# Patient Record
Sex: Male | Born: 1940 | Race: White | Hispanic: No | Marital: Married | Smoking: Former smoker
Health system: Southern US, Community
[De-identification: ages and names within clinical notes are randomized; demographics above are authoritative.]

## PROBLEM LIST (undated history)

## (undated) DIAGNOSIS — I10 Essential (primary) hypertension: Secondary | ICD-10-CM

## (undated) DIAGNOSIS — N201 Calculus of ureter: Secondary | ICD-10-CM

## (undated) DIAGNOSIS — I2510 Atherosclerotic heart disease of native coronary artery without angina pectoris: Secondary | ICD-10-CM

## (undated) DIAGNOSIS — I252 Old myocardial infarction: Secondary | ICD-10-CM

## (undated) DIAGNOSIS — Z955 Presence of coronary angioplasty implant and graft: Secondary | ICD-10-CM

## (undated) DIAGNOSIS — E785 Hyperlipidemia, unspecified: Secondary | ICD-10-CM

## (undated) HISTORY — DX: Essential (primary) hypertension: I10

## (undated) HISTORY — DX: Atherosclerotic heart disease of native coronary artery without angina pectoris: I25.10

## (undated) HISTORY — PX: CARDIOVASCULAR STRESS TEST: SHX262

## (undated) HISTORY — DX: Hyperlipidemia, unspecified: E78.5

## (undated) HISTORY — PX: TRIGGER FINGER RELEASE: SHX641

---

## 1968-09-19 HISTORY — PX: ANTERIOR CRUCIATE LIGAMENT REPAIR: SHX115

## 1997-08-19 HISTORY — PX: CORONARY ANGIOPLASTY WITH STENT PLACEMENT: SHX49

## 1997-09-16 ENCOUNTER — Inpatient Hospital Stay (HOSPITAL_COMMUNITY): Admission: EM | Admit: 1997-09-16 | Discharge: 1997-09-19 | Payer: Self-pay | Admitting: Emergency Medicine

## 1997-11-02 ENCOUNTER — Observation Stay (HOSPITAL_COMMUNITY): Admission: AD | Admit: 1997-11-02 | Discharge: 1997-11-03 | Payer: Self-pay | Admitting: Cardiovascular Disease

## 1997-11-19 HISTORY — PX: CORONARY ANGIOPLASTY: SHX604

## 1997-11-22 ENCOUNTER — Encounter: Payer: Self-pay | Admitting: Cardiovascular Disease

## 1997-11-22 ENCOUNTER — Observation Stay (HOSPITAL_COMMUNITY): Admission: EM | Admit: 1997-11-22 | Discharge: 1997-11-23 | Payer: Self-pay | Admitting: Emergency Medicine

## 1998-02-15 ENCOUNTER — Encounter: Payer: Self-pay | Admitting: Cardiovascular Disease

## 1998-02-15 ENCOUNTER — Ambulatory Visit (HOSPITAL_COMMUNITY): Admission: RE | Admit: 1998-02-15 | Discharge: 1998-02-15 | Payer: Self-pay | Admitting: Cardiovascular Disease

## 1998-03-20 HISTORY — PX: CORONARY ANGIOPLASTY WITH STENT PLACEMENT: SHX49

## 1998-03-21 ENCOUNTER — Observation Stay (HOSPITAL_COMMUNITY): Admission: AD | Admit: 1998-03-21 | Discharge: 1998-03-22 | Payer: Self-pay | Admitting: Cardiovascular Disease

## 1998-09-25 ENCOUNTER — Emergency Department (HOSPITAL_COMMUNITY): Admission: EM | Admit: 1998-09-25 | Discharge: 1998-09-25 | Payer: Self-pay | Admitting: Emergency Medicine

## 1998-09-25 ENCOUNTER — Encounter: Payer: Self-pay | Admitting: Emergency Medicine

## 1998-12-08 ENCOUNTER — Inpatient Hospital Stay (HOSPITAL_COMMUNITY): Admission: EM | Admit: 1998-12-08 | Discharge: 1998-12-09 | Payer: Self-pay | Admitting: Emergency Medicine

## 2005-05-20 ENCOUNTER — Encounter: Payer: Self-pay | Admitting: Cardiovascular Disease

## 2009-09-06 ENCOUNTER — Ambulatory Visit: Payer: Self-pay | Admitting: Cardiovascular Disease

## 2009-12-26 ENCOUNTER — Ambulatory Visit
Admission: RE | Admit: 2009-12-26 | Discharge: 2009-12-26 | Payer: Self-pay | Source: Home / Self Care | Attending: Orthopedic Surgery | Admitting: Orthopedic Surgery

## 2009-12-26 HISTORY — PX: OTHER SURGICAL HISTORY: SHX169

## 2010-03-10 ENCOUNTER — Ambulatory Visit (INDEPENDENT_AMBULATORY_CARE_PROVIDER_SITE_OTHER): Payer: BC Managed Care – PPO | Admitting: Cardiovascular Disease

## 2010-03-10 DIAGNOSIS — I119 Hypertensive heart disease without heart failure: Secondary | ICD-10-CM

## 2010-03-10 DIAGNOSIS — Z9861 Coronary angioplasty status: Secondary | ICD-10-CM

## 2010-06-06 NOTE — H&P (Signed)
[place]. [facility]  Patient:    Rodney Hester                      MRN: [id number] Adm. Date:  [id number] Attending:  Koren Bound Dictator:   Darci Needle, M.D.                         History and Physical  REASON FOR ADMISSION:  Chest discomfort.  SUBJECTIVE:  The patient is 70 and has a history of coronary artery disease. He suffered an inferior myocardial infarction in 8/99.   He subsequently underwent  PTCA of the large first diagonal branch in 11/99.  He subsequently underwent the PTCA and stenting of the diagonal in 3/00 because of restenosis and also had angioplasty of the LAD at the same time.  Patient had done well since that time  until the past four days.  Over the past four days, he has experienced a flu-like complex of symptoms, including fatigue.  Today, he developed substernal tightness and shortness of breath that was mild to moderate, but did concern him enough that he went to the urgent care medical center for evaluation.  After the discomfort had been present for two to three hours, he was given a sublingual nitroglycerin and there was relatively prompt significant reduction in the discomfort.  Because of this, he was instructed to come to the emergency room and is now being admitted for observation and to rule out myocardial infarction.   Patient is currently pain free.  SIGNIFICANT PAST MEDICAL PROBLEMS: 1. Inferior myocardial infarction, treated with angioplasty and stenting of the  right coronary in 8/99. 2. History of PTCA, 11/99 and stent 3/00 to the first diagonal. 3. Hypercholesterolemia.  MEDICATIONS: 1. Lipitor 40 mg per day. 2. Coated aspirin 1 per day. 3. Metoprolol 25 mg b.i.d. 4. Claritin p.r.n.  HABITS:  Does not smoke or drink.  ALLERGIES:  He is allergic to either ZESTRIL OR ZOCOR.  FAMILY HISTORY:  Father died at age 81 of a myocardial infarction.   Mother is ge 68 and in good  health.  He has two sisters and one brother, all in good health.   REVIEW OF SYSTEMS:  Unremarkable.  OBJECTIVE:  On exam, patients blood pressure is 127/74, heart rate 74, he is afebrile, respiratory rate 16 and nonlabored.  Skin:  Warm and dry.  HEENT:  Exam reveals no exophthalmos; PERRL, extraocular movements are full.  Neck:  No JVD, carotid bruits or thyromegaly.  Lungs:  Clear to auscultation and percussion.  Cardiac:  Exam reveals no rubs, clicks, gallops, or murmurs.  Abdomen:  Soft, liver and spleen not palpable.  Bowel sounds are normal.  Extremities:  No edema.  Pulses are 2+ and symmetric in upper and lower extremities.  No peripheral edema is noted.  Neurologic:  Unremarkable.  No focal cranial nerve or motor deficits noted.  EKG reveals normal sinus rhythm, small inferior Q waves, no acute ST-T wave changes.   Chest x-ray from urgent care is normal.  LABORATORY DATA:  Unremarkable and includes a normal initial CPK that is 106 with an MB of 1.5 ng/ml.  Troponin I is less than 0.03 ng/ml.   The potassium is 4, UN 13, creatinine 0.8, sodium 140.  WBC 9,400, hemoglobin 13.8, platelet count 176,000.  ASSESSMENT:  Patients presenting symptoms are somewhat atypical.  Given the patients history of previous infarction and angioplasty  x 3, he is admitted to he hospital to rule out myocardial infarction and for further evaluation.  PLAN: 1. Serial enzymes. 2. EKG in the a.m. 3. Stress Cardiolite in the a.m. 4. Cardiac catheterization per Dr. Kristeen Miss. 5. Subcutaneous Lovenox. 6. IV nitroglycerin for chest pain. DD:  12/08/98 TD:  12/08/98 Job: [id number]

## 2010-09-19 ENCOUNTER — Encounter: Payer: Self-pay | Admitting: *Deleted

## 2010-09-19 DIAGNOSIS — I2510 Atherosclerotic heart disease of native coronary artery without angina pectoris: Secondary | ICD-10-CM | POA: Insufficient documentation

## 2010-09-30 ENCOUNTER — Other Ambulatory Visit: Payer: Self-pay | Admitting: Cardiovascular Disease

## 2010-09-30 DIAGNOSIS — E785 Hyperlipidemia, unspecified: Secondary | ICD-10-CM

## 2010-09-30 DIAGNOSIS — I2510 Atherosclerotic heart disease of native coronary artery without angina pectoris: Secondary | ICD-10-CM

## 2010-10-03 ENCOUNTER — Encounter: Payer: Self-pay | Admitting: Cardiovascular Disease

## 2010-10-03 ENCOUNTER — Ambulatory Visit (INDEPENDENT_AMBULATORY_CARE_PROVIDER_SITE_OTHER): Payer: BC Managed Care – PPO | Admitting: Cardiovascular Disease

## 2010-10-03 ENCOUNTER — Other Ambulatory Visit (INDEPENDENT_AMBULATORY_CARE_PROVIDER_SITE_OTHER): Payer: BC Managed Care – PPO | Admitting: *Deleted

## 2010-10-03 VITALS — BP 128/80 | HR 60 | Wt 154.2 lb

## 2010-10-03 DIAGNOSIS — I2510 Atherosclerotic heart disease of native coronary artery without angina pectoris: Secondary | ICD-10-CM

## 2010-10-03 DIAGNOSIS — E785 Hyperlipidemia, unspecified: Secondary | ICD-10-CM

## 2010-10-03 LAB — HEPATIC FUNCTION PANEL
ALT: 32 U/L (ref 0–53)
AST: 35 U/L (ref 0–37)
Alkaline Phosphatase: 84 U/L (ref 39–117)
Bilirubin, Direct: 0.1 mg/dL (ref 0.0–0.3)
Total Bilirubin: 0.7 mg/dL (ref 0.3–1.2)

## 2010-10-03 LAB — BASIC METABOLIC PANEL
Calcium: 9.4 mg/dL (ref 8.4–10.5)
GFR: 92.76 mL/min (ref >60.00)
Glucose, Bld: 140 mg/dL — ABNORMAL HIGH (ref 70–99)
Sodium: 145 mEq/L (ref 135–145)

## 2010-10-03 LAB — LIPID PANEL: HDL: 55.1 mg/dL (ref >39.00)

## 2010-10-03 NOTE — Assessment & Plan Note (Addendum)
[name] doing very well. We'll continue the same medications.  We have gone lipid profile and basic metabolic profile today. The results are pending. I'll see him again in one year.

## 2010-10-03 NOTE — Progress Notes (Signed)
Raj Janus Date of Birth  1940-09-09 [facility] / [facility] [address]     [address] [place], [place]  [address] [phone]  Fax  [phone]  History of Present Illness:  70 year old gentleman with a history of coronary artery disease. Status post myocardial infarction in 1998.  He has done very well. He has not had any episodes of angina. He works out on a daily basis.  Current Outpatient Prescriptions on File Prior to Visit  Medication Sig Dispense Refill  . aspirin 81 MG tablet Take 81 mg by mouth daily.        Marland Kitchen atorvastatin (LIPITOR) 40 MG tablet Take 40 mg by mouth daily.        . metoprolol (LOPRESSOR) 50 MG tablet Take 50 mg by mouth 2 (two) times daily. Taking 1/2 bid       . nitroGLYCERIN (NITROSTAT) 0.4 MG SL tablet Place 0.4 mg under the tongue every 5 (five) minutes as needed.        Marland Kitchen telmisartan (MICARDIS) 80 MG tablet Take 80 mg by mouth daily.        . ALBUTEROL IN Inhale into the lungs. As directed         Allergies  Allergen Reactions  . Lisinopril   . Zocor (Simvastatin)     Past Medical History  Diagnosis Date  . Hyperlipidemia   . Hypertension   . Coronary artery disease     MI 8/99    Past Surgical History  Procedure Date  . Cardiac catheterization 8/99    angioplasty and stenting of right coronary  . Cardiac catheterization 11/99    PTCA   . Cardiac catheterization 3/00    stent , first diagonal    History  Smoking status  . Former Smoker  Smokeless tobacco  . Not on file    History  Alcohol Use     Family History  Problem Relation Age of Onset  . Heart attack Father     Reviw of Systems:  Reviewed in the HPI.  All other systems are negative.  Physical Exam: BP 128/80  Pulse 60  Wt 154 lb 3.2 oz (69.945 kg) The patient is alert and oriented x 3.  The mood and affect are normal.   Skin: warm and dry.  Color is normal.    HEENT:   the sclera are nonicteric.  The mucous membranes are  moist.  The carotids are 2+ without bruits.  There is no thyromegaly.  There is no JVD.    Lungs: clear.  The chest wall is non tender.    Heart: regular rate with a normal S1 and S2.  There are no murmurs, gallops, or rubs. The PMI is not displaced.     Abdomen: good bowel sounds.  There is no guarding or rebound.  There is no hepatosplenomegaly or tenderness.  There are no masses.   Extremities:  no clubbing, cyanosis, or edema.  The legs are without rashes.  The distal pulses are intact.   Neuro:  Cranial nerves II - XII are intact.  Motor and sensory functions are intact.    The gait is normal.  ECG:  Assessment / Plan:

## 2010-10-20 ENCOUNTER — Other Ambulatory Visit: Payer: Self-pay | Admitting: Cardiovascular Disease

## 2010-10-21 ENCOUNTER — Other Ambulatory Visit: Payer: Self-pay | Admitting: *Deleted

## 2010-11-24 ENCOUNTER — Other Ambulatory Visit: Payer: Self-pay | Admitting: Cardiovascular Disease

## 2010-12-29 ENCOUNTER — Other Ambulatory Visit: Payer: Self-pay | Admitting: Cardiovascular Disease

## 2010-12-29 NOTE — Telephone Encounter (Signed)
Fax Received. Refill Completed. Rodney Hester (R.M.A)   

## 2011-05-06 ENCOUNTER — Other Ambulatory Visit: Payer: Self-pay | Admitting: Cardiovascular Disease

## 2011-05-06 MED ORDER — ATORVASTATIN CALCIUM 40 MG PO TABS: 40.0000 mg | ORAL_TABLET | Freq: Every day | ORAL | Status: DC

## 2011-05-29 ENCOUNTER — Other Ambulatory Visit: Payer: Self-pay | Admitting: Orthopedic Surgery

## 2011-06-01 NOTE — H&P (Signed)
  Mr. Waymire is a well known patient of ours who returns with a new predicament involving his left ring finger. He awoke approximately 5-6 weeks ago with symptoms of triggering of the left ring finger. He has no known injury. He tried anti-inflammatories and ice without relief. He states that his symptoms are much worse in the morning though he does have triggering throughout the day which is quite bothersome and painful for him. He presents today for orthopaedic hand surgery consult.   His past medical history is reviewed. He has no history of diabetes. Review of systems reveals previous heart attack, asthma, easy bleeding and bruising. Current medications are Micardis 80 mg 1 PO qd, Metoprolol 50 mg 1 PO qd, Atorvastatin 40 mg 1 PO qd and aspirin 325 mg PO qd. He has had several hand surgeries in the past all without anesthetic complications. Family history reveals no history of diabetes, heart disease or hypertension. There is a family history of arthritis. His social history reveals he does not smoke or consume alcohol based beverages. He is married and resides with his wife in [place], [place]. Dr. Kirby Funk is his primary medical doctor.  On exam he is a well appearing 71 year old right hand dominant male. Examination of his hands reveals active triggering of the left ring finger with a swollen tendon nodule at the area of the A-1 pulley. He does have intact neurovascular status. There is no triggering of any of the other digits. He has normal sweat patterns and dermatoglyphics.   Impression: STS left ring finger.   Plan: Patient underwent surgical treatment for the right ring finger in December of 2011 and wishes to proceed with surgical intervention for this problem. We tried injections to the right ring finger without relief. The surgery, after care, risks and benefits were described in detail. He is in agreement with this plan. He will therefore be scheduled as an outpatient to have release of  the A-1 pulley of the left ring finger under local anesthesia.   H&P documentation: 06/02/2011  -History and Physical Reviewed  -Patient has been re-examined  -No change in the plan of care  Wyn Forster, MD

## 2011-06-02 ENCOUNTER — Ambulatory Visit (HOSPITAL_BASED_OUTPATIENT_CLINIC_OR_DEPARTMENT_OTHER)
Admission: RE | Admit: 2011-06-02 | Discharge: 2011-06-02 | Disposition: A | Discharge status: Home / Self Care | Payer: BC Managed Care – PPO | Source: Ambulatory Visit | Attending: Orthopedic Surgery | Admitting: Orthopedic Surgery

## 2011-06-02 ENCOUNTER — Encounter (HOSPITAL_BASED_OUTPATIENT_CLINIC_OR_DEPARTMENT_OTHER)
Admission: RE | Disposition: A | Discharge status: Home / Self Care | Payer: Self-pay | Source: Ambulatory Visit | Attending: Orthopedic Surgery

## 2011-06-02 DIAGNOSIS — M65839 Other synovitis and tenosynovitis, unspecified forearm: Secondary | ICD-10-CM | POA: Insufficient documentation

## 2011-06-02 DIAGNOSIS — M6530 Trigger finger, unspecified finger: Secondary | ICD-10-CM | POA: Insufficient documentation

## 2011-06-02 SURGERY — MINOR RELEASE TRIGGER FINGER/A-1 PULLEY
Anesthesia: LOCAL | Site: Finger | Laterality: Left

## 2011-06-02 MED ORDER — CHLORHEXIDINE GLUCONATE 4 % EX LIQD: 60.0000 mL | Freq: Once | CUTANEOUS | Status: DC

## 2011-06-02 MED ORDER — LIDOCAINE HCL 2 % IJ SOLN
INTRAMUSCULAR | Status: DC | PRN
  Administered 2011-06-02: 2.5 mL

## 2011-06-02 MED ORDER — DOXYCYCLINE HYCLATE 100 MG PO TABS: 100.0000 mg | ORAL_TABLET | Freq: Two times a day (BID) | ORAL | Status: AC

## 2011-06-02 MED ORDER — TRAMADOL HCL 50 MG PO TABS: ORAL_TABLET | ORAL | Status: AC

## 2011-06-02 SURGICAL SUPPLY — 37 items
BANDAGE ADHESIVE 1X3 (GAUZE/BANDAGES/DRESSINGS) IMPLANT
BLADE SURG 15 STRL LF DISP TIS (BLADE) ×1 IMPLANT
BLADE SURG 15 STRL SS (BLADE) ×2
BNDG CMPR 9X4 STRL LF SNTH (GAUZE/BANDAGES/DRESSINGS) ×1
BNDG CMPR MD 5X2 ELC HKLP STRL (GAUZE/BANDAGES/DRESSINGS) ×1
BNDG ELASTIC 2 VLCR STRL LF (GAUZE/BANDAGES/DRESSINGS) ×2 IMPLANT
BNDG ESMARK 4X9 LF (GAUZE/BANDAGES/DRESSINGS) ×1 IMPLANT
BRUSH SCRUB EZ PLAIN DRY (MISCELLANEOUS) ×2 IMPLANT
CLOTH BEACON ORANGE TIMEOUT ST (SAFETY) ×2 IMPLANT
CORDS BIPOLAR (ELECTRODE) IMPLANT
COVER MAYO STAND STRL (DRAPES) ×2 IMPLANT
COVER TABLE BACK 60X90 (DRAPES) ×2 IMPLANT
CUFF TOURNIQUET SINGLE 18IN (TOURNIQUET CUFF) ×1 IMPLANT
DECANTER SPIKE VIAL GLASS SM (MISCELLANEOUS) IMPLANT
DRAPE EXTREMITY T 121X128X90 (DRAPE) ×2 IMPLANT
DRAPE SURG 17X23 STRL (DRAPES) ×2 IMPLANT
GAUZE SPONGE 4X4 12PLY STRL LF (GAUZE/BANDAGES/DRESSINGS) ×4 IMPLANT
GAUZE XEROFORM 1X8 LF (GAUZE/BANDAGES/DRESSINGS) IMPLANT
GLOVE BIOGEL M 7.0 STRL (GLOVE) ×3 IMPLANT
GLOVE BIOGEL M STRL SZ7.5 (GLOVE) ×2 IMPLANT
GLOVE ORTHO TXT STRL SZ7.5 (GLOVE) ×2 IMPLANT
GOWN PREVENTION PLUS XLARGE (GOWN DISPOSABLE) ×4 IMPLANT
NEEDLE 27GAX1X1/2 (NEEDLE) ×1 IMPLANT
PACK BASIN DAY SURGERY FS (CUSTOM PROCEDURE TRAY) ×2 IMPLANT
PADDING CAST ABS 4INX4YD NS (CAST SUPPLIES) ×1
PADDING CAST ABS COTTON 4X4 ST (CAST SUPPLIES) ×1 IMPLANT
SPONGE GAUZE 4X4 12PLY (GAUZE/BANDAGES/DRESSINGS) ×2 IMPLANT
STOCKINETTE 4X48 STRL (DRAPES) ×2 IMPLANT
SUT ETHILON 5 0 P 3 18 (SUTURE) ×1
SUT NYLON ETHILON 5-0 P-3 1X18 (SUTURE) ×1 IMPLANT
SUT PROLENE 4 0 P 3 18 (SUTURE) ×1 IMPLANT
SYR 3ML 23GX1 SAFETY (SYRINGE) IMPLANT
SYR CONTROL 10ML LL (SYRINGE) ×1 IMPLANT
TOWEL OR 17X24 6PK STRL BLUE (TOWEL DISPOSABLE) ×4 IMPLANT
TRAY DSU PREP LF (CUSTOM PROCEDURE TRAY) ×2 IMPLANT
UNDERPAD 30X30 INCONTINENT (UNDERPADS AND DIAPERS) ×2 IMPLANT
WATER STERILE IRR 1000ML POUR (IV SOLUTION) ×1 IMPLANT

## 2011-06-02 NOTE — Brief Op Note (Signed)
06/02/2011  11:23 AM  PATIENT:  Rodney Hester  71 y.o. male  PRE-OPERATIVE DIAGNOSIS:  left ring trigger finger  POST-OPERATIVE DIAGNOSIS:  left ring trigger finger    PROCEDURE: RELEASE TRIGGER FINGER LEFT RING FINGER  SURGEON:  Wyn Forster., MD   PHYSICIAN ASSISTANT:   ASSISTANTS: Mallory Shirk.A-C   ANESTHESIA:   local  EBL:     BLOOD ADMINISTERED:none  DRAINS: none   LOCAL MEDICATIONS USED:  XYLOCAINE   SPECIMEN:  No Specimen  DISPOSITION OF SPECIMEN:  N/A  COUNTS:  YES  TOURNIQUET:   Total Tourniquet Time Documented: Upper Arm (Left) - 12 minutes  DICTATION: .Other Dictation: Dictation Number [id number]  PLAN OF CARE: Discharge to home after PACU  PATIENT DISPOSITION:  PACU - hemodynamically stable.

## 2011-06-02 NOTE — Op Note (Signed)
[id number] 

## 2011-06-02 NOTE — Discharge Instructions (Signed)

## 2011-06-03 ENCOUNTER — Encounter (HOSPITAL_BASED_OUTPATIENT_CLINIC_OR_DEPARTMENT_OTHER): Payer: Self-pay

## 2011-06-03 NOTE — Op Note (Signed)
NAMEMUAZ, SHOREY NO.:  [id number]  MEDICAL RECORD NO.:  [id number]  LOCATION:                                 FACILITY:  PHYSICIAN:  Katy Fitch. Ngai Parcell, M.D.      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  Identification of substantial A0 pulley leading to persistent triggering after release of the A1 pulley.  OPERATION:  Release of A0 and A1 pulleys, left ring finger.  OPERATING SURGEON:  Katy Fitch. Irine Heminger, M.D.  ASSISTANT:  Marveen Reeks. Dasnoit, PA-C.  ANESTHESIA:  Lidocaine 2% field block and flexor sheath block.  This was performed as a minor operating room procedure.  INDICATIONS:  Savas Elvin is a 71 year old gentleman, who enjoys playing golf.  We have followed him for stenosing tenosynovitis of his fingers.  He currently has a painful triggering of his left ring finger, which he states it has been the most painful trigger he has had to date. He has elected not to try steroid injection after failure of previous treatment of his right ring finger trigger finger with surgery after failed injection.  After informed consent, he presents at this time anticipating the release of his left ring finger A1 pulley.  PROCEDURE:  Hawthorne Day was brought to room 5 of the [facility] and placed supine position on the operating table.  Following informed consent, alcohol and Betadine prep, 2% lidocaine was infiltrated in the path of intended incision in the palm overlying the ring finger flexor sheath.  The left arm was then prepped with Betadine soap and solution, sterilely draped.  We initially elected to try an Esmarch bandage on the forearm as an arterial tourniquet.  After informed  consent and a routine surgical time-out, we exsanguinated the left hand and arm with an Esmarch and left the Esmarch on the proximal forearm as a tourniquet.  This did not control Mr. Pitta's blood pressure.  At age 71, he likely has a degree of systolic hypertension exacerbated by being wide awake in the OR.  We subsequently placed a pneumatic tourniquet on the proximal left brachium, followed by exsanguination of the left arm with Esmarch bandage and inflation of the arterial tourniquet to 250 mmHg.  After confirming adequate anesthesia, we proceeded with a short oblique incision directly over the A1 pulley.  Subcutaneous tissues were carefully divided revealing abundant fat pad.  Soft tissues were carefully retracted followed by placement of 4 blunt Ragnell retractors. The A1 pulley was isolated, split with scalpel and scissors.  The tendons were inspected and found to be invested with a minor inflammatory synovium.  Thereafter Mr. Retana demonstrated full active range of motion, but had residual proximal triggering, therefore we performed a proximal dissection identifying a discrete 4 mm wide A0 pulley.  This was likewise released.  Thereafter full range of motion of the fingers recovered.  The wound was inspected for bleeding points, followed by release of the tourniquet.  The  wound was repaired with intradermal 4-0 Prolene suture.  A compressive dressing was applied with Xeroflo sterile gauze and Ace wrap.  For aftercare, Mr. Dimaio work on range of motion exercises immediately.  We anticipate that he will remove his dressing in 3 days and begin using Band-Aids.  We will see him back for followup in our office in 1 week for suture removal.  For aftercare, he is provided Ultram 50 mg, 1 p.o. q.4 hours p.r.n. pain, 20 tablets, without refill; also doxycycline 100 mg p.o. b.i.d. x4 days as prophylactic antibiotic.     Katy Fitch Charlane Westry, M.D.     RVS/MEDQ  D:   06/02/2011  T:  06/02/2011  Job:  [id number]

## 2011-06-05 ENCOUNTER — Encounter (HOSPITAL_BASED_OUTPATIENT_CLINIC_OR_DEPARTMENT_OTHER): Payer: Self-pay | Admitting: Orthopedic Surgery

## 2011-06-20 ENCOUNTER — Other Ambulatory Visit: Payer: Self-pay | Admitting: *Deleted

## 2011-06-20 MED ORDER — TELMISARTAN 80 MG PO TABS: 80.0000 mg | ORAL_TABLET | Freq: Every day | ORAL | Status: DC

## 2011-06-30 ENCOUNTER — Other Ambulatory Visit: Payer: Self-pay | Admitting: *Deleted

## 2011-06-30 MED ORDER — METOPROLOL TARTRATE 50 MG PO TABS: 25.0000 mg | ORAL_TABLET | Freq: Two times a day (BID) | ORAL | Status: DC

## 2011-06-30 NOTE — Telephone Encounter (Signed)
Fax Received. Refill Completed. Rodney Hester (R.M.A)   

## 2011-07-31 ENCOUNTER — Encounter (HOSPITAL_COMMUNITY): Payer: Self-pay

## 2011-07-31 NOTE — Pre-Procedure Instructions (Signed)
No aspirin, advil , or toradol until after litho  Take laxative as instructed in blue folder   

## 2011-08-02 NOTE — H&P (Signed)
History of Present Illness   [name] is still asymptomatic with his left ureteral stone. He says when he golf's for 30 minutes or longer he can have some soreness the next morning which is probably not related. He has not seen anymore blood in his urine. He has mild stable frequency.   I believe I can see the stone nicely at L5 spinous process on the left side on KUB.  I drew him a picture. I went over watchful waiting recognizing the size and the shape of the stone. I went over lithotripsy.   We talked about ESWL in detail. Pros, cons, general surgical and anesthetic risks, and other options including watchful waiting and ureteroscopy were discussed. Success and failure rates and need for further/repeat therapy were discussed. Risks were described but not limited to pain, infection, sepsis, and bleeding. The risk of renal and ureteral trauma with short and long term sequelae was discussed. The risk of injury to adjacent structures was discussed. The risk of needing a stent post-ESWL was discussed.  I went over ureteroscopy. I went over pros and cons and risks including likely requiring a stent and injury requiring further surgery and even open surgery. He understood that ureteroscopy may be challenging with the edema issue that appears to be associated with a stone. He understands that lithotripsy may be less successful, no only because of stone hardness but also because of the associated swelling or edema.   Review of systems: No change in bowel or neurologic status.   He understood that ureteroscopy would also prove to be likely diagnostic for the edema and/or lesion.     [name] smoked 40 years ago.      Past Medical History Problems  1. History of  Coronary Artery Disease V12.59 2. 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 Former Smoker V15.82 4. History of  Fracture 829.0 5. History of  Fracture Of The Clavicle 810.00 6. History of   Hyperlipidemia 272.4 7. History of  Insomnia 780.52 8. History of  Lower Back Pain Chronic 9. 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 History of  Knee Surgery 2. History of  Rhinologic Surgery  Current Meds 1. Atorvastatin Calcium 40 MG Oral Tablet; Therapy: 17Apr2013 to 2. Coenzyme Q-10 100 MG Oral Capsule; Therapy: (Recorded:02Jul2013) to 3. CVS Aspirin 325 MG Oral Tablet; Therapy: (Recorded:02Jul2013) to 4. Micardis 80 MG Oral Tablet; Therapy: 01Jun2013 to 5. Multi Vitamin/Minerals TABS; Therapy: (Recorded:02Jul2013) to  Allergies Medication  1. Zestril TABS 2. Zocor TABS Non-Medication  3. Latex  Social History Problems  1. Alcohol Use wine 2-3 a week 2. Marital History - Currently Married 3. 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 Gross Hematuria 599.71 2. Urinary Calculus On The Left 592.9  Plan  UA With REFLEX  Status: Resulted - Requires Verification  Done: 01Jan0001 12:00AM Ordered Today; For: Health Maintenance (V70.0); Ordered By: Alfredo Martinez  Due: 14Jul2013 Marked Important; Last Updated By: Blinda Leatherwood   Discussion/Summary   Both [name] and I came up with the same conclusion. He elected to have lithotripsy. He would like to move on with things. Issues to go to the [facility] ER or call the office were discussed. He will have a follow-up CT 2-3 months following to make sure the ureter looks normal. I did not send urine for cytology today.  After a  thorough review of the management options for the patient's condition the patient  elected to proceed with surgical therapy as noted above. We have discussed the potential benefits and risks of the procedure, side effects of the proposed treatment, the likelihood of the patient achieving the goals of the procedure, and any potential problems that might occur during the procedure or recuperation. Informed consent has been obtained.

## 2011-08-03 ENCOUNTER — Ambulatory Visit (HOSPITAL_COMMUNITY)
Admission: RE | Admit: 2011-08-03 | Discharge: 2011-08-03 | Disposition: A | Discharge status: Home / Self Care | Payer: BC Managed Care – PPO | Source: Ambulatory Visit | Attending: Urology | Admitting: Urology

## 2011-08-03 ENCOUNTER — Other Ambulatory Visit: Payer: Self-pay | Admitting: Urology

## 2011-08-03 ENCOUNTER — Ambulatory Visit (HOSPITAL_COMMUNITY): Payer: BC Managed Care – PPO

## 2011-08-03 ENCOUNTER — Encounter (HOSPITAL_COMMUNITY)
Admission: RE | Disposition: A | Discharge status: Home / Self Care | Payer: Self-pay | Source: Ambulatory Visit | Attending: Urology

## 2011-08-03 ENCOUNTER — Encounter (HOSPITAL_COMMUNITY): Payer: Self-pay | Admitting: *Deleted

## 2011-08-03 DIAGNOSIS — I252 Old myocardial infarction: Secondary | ICD-10-CM | POA: Insufficient documentation

## 2011-08-03 DIAGNOSIS — Z01810 Encounter for preprocedural cardiovascular examination: Secondary | ICD-10-CM | POA: Insufficient documentation

## 2011-08-03 DIAGNOSIS — I10 Essential (primary) hypertension: Secondary | ICD-10-CM | POA: Insufficient documentation

## 2011-08-03 DIAGNOSIS — N201 Calculus of ureter: Secondary | ICD-10-CM | POA: Insufficient documentation

## 2011-08-03 DIAGNOSIS — E785 Hyperlipidemia, unspecified: Secondary | ICD-10-CM | POA: Insufficient documentation

## 2011-08-03 DIAGNOSIS — Z79899 Other long term (current) drug therapy: Secondary | ICD-10-CM | POA: Insufficient documentation

## 2011-08-03 HISTORY — PX: EXTRACORPOREAL SHOCK WAVE LITHOTRIPSY: SHX1557

## 2011-08-03 SURGERY — LITHOTRIPSY, ESWL
Anesthesia: LOCAL | Laterality: Left

## 2011-08-03 MED ORDER — DIPHENHYDRAMINE HCL 25 MG PO CAPS
ORAL_CAPSULE | ORAL | Status: AC
  Filled 2011-08-03: qty 1

## 2011-08-03 MED ORDER — DIAZEPAM 5 MG PO TABS
ORAL_TABLET | ORAL | Status: AC
  Filled 2011-08-03: qty 2

## 2011-08-03 MED ORDER — DIPHENHYDRAMINE HCL 25 MG PO CAPS
25.0000 mg | ORAL_CAPSULE | ORAL | Status: AC
  Administered 2011-08-03: 25 mg via ORAL

## 2011-08-03 MED ORDER — CIPROFLOXACIN HCL 500 MG PO TABS
ORAL_TABLET | ORAL | Status: AC
  Filled 2011-08-03: qty 1

## 2011-08-03 MED ORDER — DIAZEPAM 5 MG PO TABS
10.0000 mg | ORAL_TABLET | ORAL | Status: AC
  Administered 2011-08-03: 10 mg via ORAL

## 2011-08-03 MED ORDER — CIPROFLOXACIN HCL 500 MG PO TABS
500.0000 mg | ORAL_TABLET | ORAL | Status: AC
  Administered 2011-08-03: 500 mg via ORAL

## 2011-08-03 MED ORDER — DEXTROSE-NACL 5-0.45 % IV SOLN
INTRAVENOUS | Status: DC
  Administered 2011-08-03: 18:00:00 via INTRAVENOUS

## 2011-08-03 NOTE — Interval H&P Note (Signed)
History and Physical Interval Note:  08/03/2011 4:30 PM  Rodney Hester  has presented today for surgery, with the diagnosis of left ureteral stone  The various methods of treatment have been discussed with the patient and family. After consideration of risks, benefits and other options for treatment, the patient has consented to  Procedure(s) (LRB): EXTRACORPOREAL SHOCK WAVE LITHOTRIPSY (ESWL) (Left) as a surgical intervention .  The patient's history has been reviewed, patient examined, no change in status, stable for surgery.  I have reviewed the patients' chart and labs.  Questions were answered to the patient's satisfaction.     Aliana Kreischer A

## 2011-08-04 ENCOUNTER — Encounter (HOSPITAL_COMMUNITY): Payer: Self-pay

## 2011-09-03 ENCOUNTER — Other Ambulatory Visit: Payer: Self-pay | Admitting: Urology

## 2011-09-08 ENCOUNTER — Encounter (HOSPITAL_BASED_OUTPATIENT_CLINIC_OR_DEPARTMENT_OTHER): Payer: Self-pay | Admitting: *Deleted

## 2011-09-08 NOTE — Progress Notes (Signed)
NPO AFTER MN. ARRIVES AT 0815. NEEDS ISTAT AND KUB. CURRENT EKG IN EPIC AND CHART. WILL TAKE MICARDIS AND LOPRESSOR AM OF SURG W/ SIP OF WATER.

## 2011-09-10 NOTE — H&P (Signed)
History of Present Illness     Ureterolithiasis: He was evaluated for hematuria and found to have a 5 x 9 mm stone in the distal left ureter. He this was treated with lithotripsy on 08/03/11 however the stone appeared to show no fragmentation. A previous CT scan revealed no renal calculi present.  Interval history: He said he's been dealing with this stone for about 2 months. He would like to get it behind him. He still not having a lot of pain but he also has not passed his stone.   Past Medical History Problems  1. History of  Coronary Artery Disease V12.59 2. 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 Former Smoker V15.82 4. History of  Fracture 829.0 5. History of  Fracture Of The Clavicle 810.00 6. History of  Hyperlipidemia 272.4 7. History of  Insomnia 780.52 8. History of  Lower Back Pain Chronic 9. 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 History of  Knee Surgery 2. History of  Lithotripsy 3. History of  Rhinologic Surgery  Current Meds 1. Atorvastatin Calcium 40 MG Oral Tablet; Therapy: 17Apr2013 to 2. Coenzyme Q-10 100 MG Oral Capsule; Therapy: (Recorded:02Jul2013) to 3. CVS Aspirin 325 MG Oral Tablet; Therapy: (Recorded:02Jul2013) to 4. Hydrocodone-Acetaminophen 5-500 MG Oral Tablet; Therapy: 15Jul2013 to 5. Micardis 80 MG Oral Tablet; Therapy: 01Jun2013 to 6. Multi Vitamin/Minerals TABS; Therapy: (Recorded:02Jul2013) to  Allergies Medication  1. Zestril TABS 2. Zocor TABS Non-Medication  3. Latex  Social History Problems  1. Alcohol Use wine 2-3 a week 2. Marital History - Currently Married 3. Occupation: works in Airline pilot 2 days a week   Review of Systems Constitutional, skin, eye, otolaryngeal, hematologic/lymphatic, cardiovascular, pulmonary, endocrine, musculoskeletal, gastrointestinal, neurological and psychiatric system(s) were reviewed and pertinent findings if present are noted.  Genitourinary:  hematuria.    Vitals Vital Signs  BMI Calculated: 27.11 BSA Calculated: 1.73 Height: 5 ft 3 in Weight: 153 lb  Blood Pressure: 157 / 74 Temperature: 98.3 F Heart Rate: 57  Physical Exam Constitutional: Well nourished and well developed . No acute distress.  ENT:. The ears and nose are normal in appearance.  Neck: The appearance of the neck is normal and no neck mass is present.  Pulmonary: No respiratory distress and normal respiratory rhythm and effort.  Cardiovascular: Heart rate and rhythm are normal . No peripheral edema.  Abdomen: The abdomen is soft and nontender. No masses are palpated. No CVA tenderness. No hernias are palpable. No hepatosplenomegaly noted.  Lymphatics: The femoral and inguinal nodes are not enlarged or tender.  Skin: Normal skin turgor, no visible rash and no visible skin lesions.  Neuro/Psych:. Mood and affect are appropriate.   . Genitourinary: On physical examination Mr. Speir had a 40 gram benign feeling prostate.  He had normal male genitalia.   Assessment Assessed  1. Distal Ureteral Stone On The Left 592.1   I went over with him the treatment options which would be continued medical expulsive therapy which I don't think is probably a good idea since his stone has not shown further progression. The next option would be lithotripsy and we did discuss the fact that this may fragment the stone although it didn't fragment initially and since there was no appreciable change in the stone I have not recommended repeating that but rather treating the stone with ureteroscopy. I went over ureteroscopy with him in detail including its potential risks and complications we also discussed the probability  of success and the likely need for a stent after the procedure. He understands, has had his questions answered and has elected to proceed.   Plan    We will proceed with left ureteroscopy and laser lithotripsy of his left distal ureteral stone. We discussed the  fact that latex does not cause an allergy.

## 2011-09-11 ENCOUNTER — Encounter (HOSPITAL_BASED_OUTPATIENT_CLINIC_OR_DEPARTMENT_OTHER): Payer: Self-pay | Admitting: *Deleted

## 2011-09-11 ENCOUNTER — Ambulatory Visit (HOSPITAL_BASED_OUTPATIENT_CLINIC_OR_DEPARTMENT_OTHER)
Admission: RE | Admit: 2011-09-11 | Discharge: 2011-09-11 | Disposition: A | Discharge status: Home / Self Care | Payer: BC Managed Care – PPO | Source: Ambulatory Visit | Attending: Urology | Admitting: Urology

## 2011-09-11 ENCOUNTER — Ambulatory Visit (HOSPITAL_BASED_OUTPATIENT_CLINIC_OR_DEPARTMENT_OTHER): Payer: BC Managed Care – PPO | Admitting: Anesthesiology

## 2011-09-11 ENCOUNTER — Encounter (HOSPITAL_BASED_OUTPATIENT_CLINIC_OR_DEPARTMENT_OTHER): Payer: Self-pay | Admitting: Anesthesiology

## 2011-09-11 ENCOUNTER — Ambulatory Visit (HOSPITAL_COMMUNITY): Payer: BC Managed Care – PPO

## 2011-09-11 ENCOUNTER — Encounter (HOSPITAL_BASED_OUTPATIENT_CLINIC_OR_DEPARTMENT_OTHER)
Admission: RE | Disposition: A | Discharge status: Home / Self Care | Payer: Self-pay | Source: Ambulatory Visit | Attending: Urology

## 2011-09-11 DIAGNOSIS — N201 Calculus of ureter: Secondary | ICD-10-CM

## 2011-09-11 DIAGNOSIS — I10 Essential (primary) hypertension: Secondary | ICD-10-CM | POA: Insufficient documentation

## 2011-09-11 DIAGNOSIS — E785 Hyperlipidemia, unspecified: Secondary | ICD-10-CM | POA: Insufficient documentation

## 2011-09-11 DIAGNOSIS — Z79899 Other long term (current) drug therapy: Secondary | ICD-10-CM | POA: Insufficient documentation

## 2011-09-11 DIAGNOSIS — I2510 Atherosclerotic heart disease of native coronary artery without angina pectoris: Secondary | ICD-10-CM | POA: Insufficient documentation

## 2011-09-11 HISTORY — PX: CYSTOSCOPY/RETROGRADE/URETEROSCOPY/STONE EXTRACTION WITH BASKET: SHX5317

## 2011-09-11 HISTORY — DX: Presence of coronary angioplasty implant and graft: Z95.5

## 2011-09-11 HISTORY — DX: Old myocardial infarction: I25.2

## 2011-09-11 HISTORY — DX: Calculus of ureter: N20.1

## 2011-09-11 LAB — POCT I-STAT 4, (NA,K, GLUC, HGB,HCT)
Glucose, Bld: 138 mg/dL — ABNORMAL HIGH (ref 70–99)
HCT: 43 % (ref 39.0–52.0)
Hemoglobin: 14.6 g/dL (ref 13.0–17.0)
Potassium: 4.5 mEq/L (ref 3.5–5.1)
Sodium: 142 mEq/L (ref 135–145)

## 2011-09-11 SURGERY — CYSTOSCOPY, WITH CALCULUS REMOVAL USING BASKET
Anesthesia: General | Site: Ureter | Laterality: Left | Wound class: Clean Contaminated

## 2011-09-11 MED ORDER — OXYCODONE HCL 5 MG PO TABS: 5.0000 mg | ORAL_TABLET | Freq: Once | ORAL | Status: DC | PRN

## 2011-09-11 MED ORDER — ACETAMINOPHEN 10 MG/ML IV SOLN: 1000.0000 mg | Freq: Once | INTRAVENOUS | Status: DC | PRN

## 2011-09-11 MED ORDER — MEPERIDINE HCL 25 MG/ML IJ SOLN: 6.2500 mg | INTRAMUSCULAR | Status: DC | PRN

## 2011-09-11 MED ORDER — HYDROMORPHONE HCL PF 1 MG/ML IJ SOLN: 0.2500 mg | INTRAMUSCULAR | Status: DC | PRN

## 2011-09-11 MED ORDER — PHENAZOPYRIDINE HCL 200 MG PO TABS: 200.0000 mg | ORAL_TABLET | Freq: Three times a day (TID) | ORAL | Status: AC | PRN

## 2011-09-11 MED ORDER — LIDOCAINE HCL (CARDIAC) 20 MG/ML IV SOLN
INTRAVENOUS | Status: DC | PRN
  Administered 2011-09-11: 75 mg via INTRAVENOUS

## 2011-09-11 MED ORDER — PHENAZOPYRIDINE HCL 200 MG PO TABS
200.0000 mg | ORAL_TABLET | Freq: Once | ORAL | Status: AC
  Administered 2011-09-11: 200 mg via ORAL

## 2011-09-11 MED ORDER — CIPROFLOXACIN IN D5W 200 MG/100ML IV SOLN
200.0000 mg | INTRAVENOUS | Status: AC
  Administered 2011-09-11: 200 mg via INTRAVENOUS

## 2011-09-11 MED ORDER — IOHEXOL 350 MG/ML SOLN
INTRAVENOUS | Status: DC | PRN
  Administered 2011-09-11: 5 mL

## 2011-09-11 MED ORDER — FENTANYL CITRATE 0.05 MG/ML IJ SOLN
INTRAMUSCULAR | Status: DC | PRN
  Administered 2011-09-11 (×2): 50 ug via INTRAVENOUS

## 2011-09-11 MED ORDER — HYDROCODONE-ACETAMINOPHEN 10-325 MG PO TABS: 1.0000 | ORAL_TABLET | ORAL | Status: AC | PRN

## 2011-09-11 MED ORDER — PROMETHAZINE HCL 25 MG/ML IJ SOLN: 6.2500 mg | INTRAMUSCULAR | Status: DC | PRN

## 2011-09-11 MED ORDER — OXYCODONE HCL 5 MG/5ML PO SOLN: 5.0000 mg | Freq: Once | ORAL | Status: DC | PRN

## 2011-09-11 MED ORDER — SODIUM CHLORIDE 0.9 % IR SOLN
Status: DC | PRN
  Administered 2011-09-11: 1 via INTRAVESICAL

## 2011-09-11 MED ORDER — LACTATED RINGERS IV SOLN
INTRAVENOUS | Status: DC
  Administered 2011-09-11 (×2): via INTRAVENOUS

## 2011-09-11 MED ORDER — PROPOFOL 10 MG/ML IV EMUL
INTRAVENOUS | Status: DC | PRN
  Administered 2011-09-11: 250 mg via INTRAVENOUS

## 2011-09-11 MED ORDER — EPHEDRINE SULFATE 50 MG/ML IJ SOLN
INTRAMUSCULAR | Status: DC | PRN
  Administered 2011-09-11: 10 mg via INTRAVENOUS

## 2011-09-11 SURGICAL SUPPLY — 38 items
ADAPTER CATH URET PLST 4-6FR (CATHETERS) ×2 IMPLANT
ADPR CATH URET STRL DISP 4-6FR (CATHETERS) ×2
BAG DRAIN URO-CYSTO SKYTR STRL (DRAIN) ×3 IMPLANT
BAG DRN UROCATH (DRAIN) ×2
BASKET LASER NITINOL 1.9FR (BASKET) IMPLANT
BASKET STNLS GEMINI 4WIRE 3FR (BASKET) IMPLANT
BASKET ZERO TIP NITINOL 2.4FR (BASKET) ×2 IMPLANT
BRUSH URET BIOPSY 3F (UROLOGICAL SUPPLIES) IMPLANT
BSKT STON RTRVL 120 1.9FR (BASKET)
BSKT STON RTRVL GEM 120X11 3FR (BASKET)
BSKT STON RTRVL ZERO TP 2.4FR (BASKET) ×2
CANISTER SUCT LVC 12 LTR MEDI- (MISCELLANEOUS) ×2 IMPLANT
CATH INTERMIT  6FR 70CM (CATHETERS) ×2 IMPLANT
CATH URET 5FR 28IN CONE TIP (BALLOONS)
CATH URET 5FR 70CM CONE TIP (BALLOONS) IMPLANT
CLOTH BEACON ORANGE TIMEOUT ST (SAFETY) ×3 IMPLANT
DRAPE CAMERA CLOSED 9X96 (DRAPES) ×3 IMPLANT
ELECT REM PT RETURN 9FT ADLT (ELECTROSURGICAL)
ELECTRODE REM PT RTRN 9FT ADLT (ELECTROSURGICAL) IMPLANT
GLOVE BIO SURGEON STRL SZ8 (GLOVE) ×3 IMPLANT
GLOVE INDICATOR 7.0 STRL GRN (GLOVE) ×2 IMPLANT
GOWN PREVENTION PLUS LG XLONG (DISPOSABLE) ×3 IMPLANT
GOWN STRL REIN XL XLG (GOWN DISPOSABLE) ×3 IMPLANT
GUIDEWIRE 0.038 PTFE COATED (WIRE) IMPLANT
GUIDEWIRE ANG ZIPWIRE 038X150 (WIRE) IMPLANT
GUIDEWIRE STR DUAL SENSOR (WIRE) ×3 IMPLANT
IV NS IRRIG 3000ML ARTHROMATIC (IV SOLUTION) ×6 IMPLANT
KIT BALLIN UROMAX 15FX10 (LABEL) IMPLANT
KIT BALLN UROMAX 15FX4 (MISCELLANEOUS) IMPLANT
KIT BALLN UROMAX 26 75X4 (MISCELLANEOUS)
LASER FIBER DISP (UROLOGICAL SUPPLIES) IMPLANT
PACK CYSTOSCOPY (CUSTOM PROCEDURE TRAY) ×3 IMPLANT
SET HIGH PRES BAL DIL (LABEL)
SHEATH ACCESS URETERAL 38CM (SHEATH) IMPLANT
SHEATH ACCESS URETERAL 54CM (SHEATH) IMPLANT
SHEATH URET ACCESS 12FR/35CM (UROLOGICAL SUPPLIES) ×2 IMPLANT
SHEATH URET ACCESS 12FR/55CM (UROLOGICAL SUPPLIES) IMPLANT
WATER STERILE IRR 3000ML UROMA (IV SOLUTION) IMPLANT

## 2011-09-11 NOTE — Anesthesia Preprocedure Evaluation (Signed)
Anesthesia Evaluation  Patient identified by MRN, date of birth, ID band Patient awake    Reviewed: Allergy & Precautions, H&P , NPO status , Patient's Chart, lab work & pertinent test results, reviewed documented beta blocker date and time   History of Anesthesia Complications Negative for: history of anesthetic complications  Airway Mallampati: I TM Distance: >3 FB Neck ROM: Full    Dental  (+) Teeth Intact and Dental Advisory Given   Pulmonary neg pulmonary ROS,  breath sounds clear to auscultation  Pulmonary exam normal       Cardiovascular Exercise Tolerance: Good hypertension, Pt. on medications and Pt. on home beta blockers + CAD Rhythm:Regular Rate:Normal     Neuro/Psych negative neurological ROS     GI/Hepatic negative GI ROS, Neg liver ROS,   Endo/Other  negative endocrine ROS  Renal/GU negative Renal ROS     Musculoskeletal   Abdominal (+)  Abdomen: soft.    Peds  Hematology   Anesthesia Other Findings   Reproductive/Obstetrics                           Anesthesia Physical Anesthesia Plan  ASA: II  Anesthesia Plan: General   Post-op Pain Management:    Induction:   Airway Management Planned: LMA  Additional Equipment:   Intra-op Plan:   Post-operative Plan: Extubation in OR  Informed Consent: I have reviewed the patients History and Physical, chart, labs and discussed the procedure including the risks, benefits and alternatives for the proposed anesthesia with the patient or authorized representative who has indicated his/her understanding and acceptance.   Dental advisory given  Plan Discussed with: Surgeon and CRNA  Anesthesia Plan Comments:         Anesthesia Quick Evaluation

## 2011-09-11 NOTE — Anesthesia Procedure Notes (Signed)
Procedure Name: LMA Insertion Date/Time: 09/11/2011 10:24 AM Performed by: Fran Lowes Pre-anesthesia Checklist: Patient identified, Emergency Drugs available, Suction available and Patient being monitored Patient Re-evaluated:Patient Re-evaluated prior to inductionOxygen Delivery Method: Circle System Utilized Preoxygenation: Pre-oxygenation with 100% oxygen Intubation Type: IV induction Ventilation: Mask ventilation without difficulty LMA: LMA with gastric port inserted LMA Size: 4.0 Number of attempts: 1 Placement Confirmation: positive ETCO2 Tube secured with: Tape Dental Injury: Teeth and Oropharynx as per pre-operative assessment

## 2011-09-11 NOTE — Anesthesia Postprocedure Evaluation (Signed)
Anesthesia Post Note  Patient: Rodney Hester  Procedure(s) Performed: Procedure(s) (LRB): CYSTOSCOPY/RETROGRADE/URETEROSCOPY/STONE EXTRACTION WITH BASKET (Left)  Anesthesia type: General  Patient location: PACU  Post pain: Pain level controlled  Post assessment: Post-op Vital signs reviewed  Last Vitals: BP 109/77  Pulse 74  Temp 36.5 C (Oral)  Resp 14  Ht 5\' 3"  (1.6 m)  Wt 156 lb (70.761 kg)  BMI 27.63 kg/m2  SpO2 93%  Post vital signs: Reviewed  Level of consciousness: sedated  Complications: No apparent anesthesia complications

## 2011-09-11 NOTE — Interval H&P Note (Signed)
History and Physical Interval Note:  09/11/2011 9:50 AM  Rodney Hester  has presented today for surgery, with the diagnosis of Left Ureteral Stone  The various methods of treatment have been discussed with the patient and family. After consideration of risks, benefits and other options for treatment, the patient has consented to  Procedure(s) (LRB): URETEROSCOPY (Left) HOLMIUM LASER APPLICATION (Left) as a surgical intervention .  The patient's history has been reviewed, patient examined, no change in status, stable for surgery.  I have reviewed the patient's chart and labs.  Questions were answered to the patient's satisfaction.     Garnett Farm

## 2011-09-11 NOTE — Op Note (Signed)
PATIENT:  Rodney Hester  PRE-OPERATIVE DIAGNOSIS:  left Ureteral calculus  POST-OPERATIVE DIAGNOSIS: Same  PROCEDURE:  1. Cystoscopy with left retrograde pyelogram including interpretation. 2. Left ureteroscopy and stone extraction  SURGEON: Garnett Farm, MD  INDICATION: [name] is a 71 year old male patient who had a left ureteral stone and elected to proceed with lithotripsy. This was performed but the stone failed to fragment and also failed to progress. Despite medical expulsive therapy and adequate time to allow spontaneous passage the stone did not show evidence of progression and therefore he has elected to proceed with treatment using ureteroscopy at this time.  ANESTHESIA:  General  EBL:  Minimal  DRAINS: None  SPECIMEN:  Stone  DISPOSITION OF SPECIMEN:  Given to patient  DESCRIPTION OF PROCEDURE: The patient was taken to the major OR and placed on the table. General anesthesia was administered and then the patient was moved to the dorsal lithotomy position. The genitalia was sterilely prepped and draped. An official timeout was performed.  Initially the 22 French cystoscope with 12 lens was passed under direct vision down the urethra which was noted be normal. The prostate was noted to have a slightly elevated bladder neck but no significant lateral lobe hypertrophy or obstruction was noted. There were no prostatic urethral lesions. The bladder was then entered and fully inspected. It was noted be free of any tumors stones or inflammatory lesions. Ureteral orifices were of normal configuration and position. A 6 French open-ended ureteral catheter was then passed through the cystoscope into the ureteral orifice in order to perform a left retrograde pyelogram.  A retrograde pyelogram was performed by injecting full-strength contrast up the left ureter under direct fluoroscopic control. It revealed a filling defect in the distal ureter consistent with the stone seen on  the preoperative KUB. The remainder of the ureter was noted to be normal as was the intrarenal collecting system. I then passed a 0.038 inch floppy-tipped guidewire through the open ended catheter and into the area of the renal pelvis and this was left in place. The inner portion of a ureteral access sheath was then passed over the guidewire to gently dilate the intramural ureter. I then proceeded with ureteroscopy.  A 6 French rigid ureteroscope was then passed under direct into the bladder and into the left orifice and up the ureter. The stone was identified, grasped with Nitinol basket and easily extracted from the ureter along with the ureteroscope. The bladder was drained and the cystoscope was then removed. The patient tolerated the procedure well no intraoperative complications.  PLAN OF CARE: Discharge to home after PACU  PATIENT DISPOSITION:  PACU - hemodynamically stable.

## 2011-09-11 NOTE — Transfer of Care (Signed)
Immediate Anesthesia Transfer of Care Note  Patient: Rodney Hester  Procedure(s) Performed: Procedure(s) (LRB): CYSTOSCOPY/RETROGRADE/URETEROSCOPY/STONE EXTRACTION WITH BASKET (Left)  Patient Location: Patient transported to PACU with oxygen via face mask at 4 Liters / Min  Anesthesia Type: General  Level of Consciousness: awake and alert   Airway & Oxygen Therapy: Patient Spontanous Breathing and Patient connected to face mask oxygen  Post-op Assessment: Report given to PACU RN and Post -op Vital signs reviewed and stable  Post vital signs: Reviewed and stable  Dentition: Teeth and oropharynx remain in pre-op condition  Complications: No apparent anesthesia complications

## 2011-09-14 ENCOUNTER — Encounter (HOSPITAL_BASED_OUTPATIENT_CLINIC_OR_DEPARTMENT_OTHER): Payer: Self-pay | Admitting: Urology

## 2011-09-17 ENCOUNTER — Encounter (HOSPITAL_BASED_OUTPATIENT_CLINIC_OR_DEPARTMENT_OTHER): Payer: Self-pay

## 2011-10-05 ENCOUNTER — Ambulatory Visit (INDEPENDENT_AMBULATORY_CARE_PROVIDER_SITE_OTHER): Payer: BC Managed Care – PPO | Admitting: Cardiovascular Disease

## 2011-10-05 ENCOUNTER — Encounter: Payer: Self-pay | Admitting: Cardiovascular Disease

## 2011-10-05 VITALS — BP 136/79 | HR 71 | Ht 63.0 in | Wt 157.2 lb

## 2011-10-05 DIAGNOSIS — I2510 Atherosclerotic heart disease of native coronary artery without angina pectoris: Secondary | ICD-10-CM

## 2011-10-05 DIAGNOSIS — I25810 Atherosclerosis of coronary artery bypass graft(s) without angina pectoris: Secondary | ICD-10-CM

## 2011-10-05 LAB — BASIC METABOLIC PANEL
BUN: 19 mg/dL (ref 6–23)
Calcium: 9.7 mg/dL (ref 8.4–10.5)
Creatinine, Ser: 0.9 mg/dL (ref 0.4–1.5)
GFR: 85.63 mL/min (ref >60.00)
Glucose, Bld: 127 mg/dL — ABNORMAL HIGH (ref 70–99)
Potassium: 5.4 mEq/L — ABNORMAL HIGH (ref 3.5–5.1)

## 2011-10-05 LAB — HEPATIC FUNCTION PANEL
AST: 36 U/L (ref 0–37)
Albumin: 4.1 g/dL (ref 3.5–5.2)

## 2011-10-05 LAB — LIPID PANEL
Cholesterol: 135 mg/dL (ref 0–200)
Triglycerides: 46 mg/dL (ref 0.0–149.0)
VLDL: 9.2 mg/dL (ref 0.0–40.0)

## 2011-10-05 NOTE — Patient Instructions (Addendum)
Your physician recommends that you return for a FASTING lipid profile: LIVER/LIPID/BMET  IN 6 MONTHS WITH OFFICE VISIT  Your physician recommends that you HAVE lab work TODAY LIVER/LIPID/BMET AND WE WILL CALL YOU WITH THE RESULTS   Your physician wants you to follow-up in: 6 MONTHS WITH DR. Elease Hashimoto.  You will receive a reminder letter in the mail two months in advance. If you don't receive a letter, please call our office to schedule the follow-up appointment.  Your physician recommends that you continue on your current medications as directed. Please refer to the Current Medication list given to you today.

## 2011-10-05 NOTE — Progress Notes (Signed)
Rodney Hester Date of Birth  16-Sep-1940 [facility] [address]     [address] [place], [place]  [address] [phone]  Fax  [phone]   Problems: 1. CAD - s/p inferior wall myocardial infarction in 1999-status post stent placement to the right coronary artery. He is also status post stent placement to the first diagonal artery in March, 2000. At that time he had angioplasty to the LAD because of "snow plowing of plaque down the LAD from the 1st diagonal.  2. Hyperlipidemia 3. Kidney stone- left kidney,   History of Present Illness:  71 year old gentleman with a history of coronary artery disease. Status post myocardial infarction in 1998.  He has done very well. He has not had any episodes of angina. He works out on a daily basis.  He had a kidney stone this summer. He eventually required extraction of the stone by Dr. Charlyn Minerva.  [name] feeling quite a bit better and is now back doing his normal workouts.  Current Outpatient Prescriptions on File Prior to Visit  Medication Sig Dispense Refill  . aspirin 81 MG tablet Take 81 mg by mouth daily.       Marland Kitchen atorvastatin (LIPITOR) 40 MG tablet Take 40 mg by mouth every evening.      . metoprolol (LOPRESSOR) 50 MG tablet Take 0.5 tablets (25 mg total) by mouth 2 (two) times daily.  60 tablet  3  . Multiple Vitamin (MULTIVITAMIN) tablet Take 1 tablet by mouth daily.      . nitroGLYCERIN (NITROSTAT) 0.4 MG SL tablet Place 0.4 mg under the tongue every 5 (five) minutes as needed.       Marland Kitchen telmisartan (MICARDIS) 80 MG tablet Take 80 mg by mouth every morning.        Allergies  Allergen Reactions  . Lisinopril Hives and Other (See Comments)    WHEN THIS MED IS TAKEN W/ ZOCOR CAUSES HIVES  . Zocor (Simvastatin) Other (See Comments)    WHEN COMBINED W/ LISINOPRIL CAUSES HIVES    Past Medical History  Diagnosis Date  . Hyperlipidemia   . Hypertension   . History of acute inferior wall  myocardial infarction 1999-  S/P STENT RCA  . S/P primary angioplasty with coronary stent   . Left ureteral calculus   . Coronary artery disease CARDIOLOGIST- DR Elease Hashimoto    MI 8/99  (09-08-2011  PT DENIES S & S)    Past Surgical History  Procedure Date  . Trigger finger release 06/02/2011-  LOCAL ONLY    Procedure: MINOR RELEASE TRIGGER FINGER/A-1 PULLEY;  Surgeon: Wyn Forster., MD;  Location: [facility];  Service: Orthopedics;  Laterality: Left;  left ring  . Pulley release right ring finger/ limited synovectomy 12-26-2009  . Extracorporeal shock wave lithotripsy 08-03-2011    LEFT  . Anterior cruciate ligament repair 1970'S    LEFT KNEE  . Cardiovascular stress test 2011  DR Antonisha Waskey    PER PT NORMAL  . Coronary angioplasty with stent placement AUG 1999    STENTING RCA  . Coronary angioplasty NOV 1999    FIRST DIAGONAL BRANCH  . Coronary angioplasty with stent placement MAR 2000    STENTING FIRST DIAGONAL FOR RESTENOSIS  AND PCI OF LAD  . Cystoscopy/retrograde/ureteroscopy/stone extraction with basket 09/11/2011    Procedure: CYSTOSCOPY/RETROGRADE/URETEROSCOPY/STONE EXTRACTION WITH BASKET;  Surgeon: Garnett Farm, MD;  Location: [facility];  Service: Urology;  Laterality: Left;  History  Smoking status  . Former Smoker  . Types: Cigarettes  Smokeless tobacco  . Not on file  Comment: PASSIVE SMOKER QUIT >87YRS AGO    History  Alcohol Use  . Yes    OCCASIONAL    Family History  Problem Relation Age of Onset  . Heart attack Father     Reviw of Systems:  Reviewed in the HPI.  All other systems are negative.  Physical Exam: BP 136/79  Pulse 71  Ht 5\' 3"  (1.6 m)  Wt 157 lb 3.2 oz (71.305 kg)  BMI 27.85 kg/m2 The patient is alert and oriented x 3.  The mood and affect are normal.   Skin: warm and dry.  Color is normal.    HEENT:   the sclera are nonicteric.  The mucous membranes are moist.  The carotids are 2+ without bruits.   There is no thyromegaly.  There is no JVD.    Lungs: clear.  The chest wall is non tender.    Heart: regular rate with a normal S1 and S2.  There are no murmurs, gallops, or rubs. The PMI is not displaced.     Abdomen: good bowel sounds.  There is no guarding or rebound.  There is no hepatosplenomegaly or tenderness.  There are no masses.   Extremities:  no clubbing, cyanosis, or edema.  The legs are without rashes.  The distal pulses are intact.   Neuro:  Cranial nerves II - XII are intact.  Motor and sensory functions are intact.    The gait is normal.  ECG:  Assessment / Plan:

## 2011-10-05 NOTE — Assessment & Plan Note (Signed)
Rodney Hester is doing very well. [name] not had any episodes of chest pain or shortness breath. He went through kidney surgery this past year for removal of kidney stone.  We'll check fasting lipids, liver enzymes, and basic metabolic profile today. I'll see him again in 6 months for fasting lipid levels, liver enzymes, and basic metabolic profile. I've encouraged him to continue to work out on regular basis.

## 2011-10-06 ENCOUNTER — Telehealth: Payer: Self-pay | Admitting: *Deleted

## 2011-10-06 NOTE — Telephone Encounter (Signed)
tw pt labs are good

## 2011-10-08 ENCOUNTER — Encounter: Payer: Self-pay | Admitting: Cardiovascular Disease

## 2011-12-02 ENCOUNTER — Other Ambulatory Visit: Payer: Self-pay | Admitting: *Deleted

## 2011-12-02 MED ORDER — ATORVASTATIN CALCIUM 40 MG PO TABS: 40.0000 mg | ORAL_TABLET | Freq: Every evening | ORAL | Status: DC

## 2011-12-02 NOTE — Telephone Encounter (Signed)
Fax Received. Refill Completed. Rodney Hester (R.M.A)   

## 2012-01-29 ENCOUNTER — Encounter: Payer: Self-pay | Admitting: Cardiovascular Disease

## 2012-02-18 ENCOUNTER — Other Ambulatory Visit: Payer: Self-pay | Admitting: *Deleted

## 2012-02-18 MED ORDER — TELMISARTAN 80 MG PO TABS: 80.0000 mg | ORAL_TABLET | Freq: Every morning | ORAL | Status: DC

## 2012-02-18 NOTE — Telephone Encounter (Signed)
NEED APPOINTMENT Fax Received. Refill Completed. Shandy Vi Chowoe (R.M.A)   

## 2012-03-01 ENCOUNTER — Other Ambulatory Visit: Payer: Self-pay | Admitting: *Deleted

## 2012-03-01 MED ORDER — METOPROLOL TARTRATE 50 MG PO TABS: 25.0000 mg | ORAL_TABLET | Freq: Two times a day (BID) | ORAL | Status: DC

## 2012-03-01 NOTE — Telephone Encounter (Signed)
Fax Received. Refill Completed. Rodney Hester (R.M.A)   

## 2012-05-11 ENCOUNTER — Other Ambulatory Visit: Payer: Self-pay | Admitting: *Deleted

## 2012-05-11 MED ORDER — TELMISARTAN 80 MG PO TABS: 80.0000 mg | ORAL_TABLET | Freq: Every morning | ORAL | Status: DC

## 2012-05-11 NOTE — Telephone Encounter (Signed)
Pt was last seen 09-2011. Fax Received. Refill Completed. Rodney Hester (R.M.A)

## 2012-05-27 ENCOUNTER — Other Ambulatory Visit: Payer: Self-pay | Admitting: *Deleted

## 2012-05-27 MED ORDER — ATORVASTATIN CALCIUM 40 MG PO TABS: 40.0000 mg | ORAL_TABLET | Freq: Every evening | ORAL | Status: DC

## 2012-05-27 NOTE — Telephone Encounter (Signed)
Fax Received. Refill Completed. Mir Fullilove Chowoe (R.M.A)   

## 2012-07-14 ENCOUNTER — Encounter: Payer: Self-pay | Admitting: Cardiovascular Disease

## 2012-07-14 ENCOUNTER — Ambulatory Visit (INDEPENDENT_AMBULATORY_CARE_PROVIDER_SITE_OTHER): Payer: BC Managed Care – PPO | Admitting: Cardiovascular Disease

## 2012-07-14 VITALS — BP 130/70 | HR 64 | Ht 63.0 in | Wt 158.4 lb

## 2012-07-14 DIAGNOSIS — E785 Hyperlipidemia, unspecified: Secondary | ICD-10-CM

## 2012-07-14 DIAGNOSIS — I2510 Atherosclerotic heart disease of native coronary artery without angina pectoris: Secondary | ICD-10-CM

## 2012-07-14 LAB — LIPID PANEL
HDL: 56.1 mg/dL (ref >39.00)
Total CHOL/HDL Ratio: 2
VLDL: 10.6 mg/dL (ref 0.0–40.0)

## 2012-07-14 LAB — HEPATIC FUNCTION PANEL
Bilirubin, Direct: 0.1 mg/dL (ref 0.0–0.3)
Total Bilirubin: 0.9 mg/dL (ref 0.3–1.2)

## 2012-07-14 LAB — BASIC METABOLIC PANEL
BUN: 17 mg/dL (ref 6–23)
CO2: 29 mEq/L (ref 19–32)
Calcium: 9.6 mg/dL (ref 8.4–10.5)
Glucose, Bld: 121 mg/dL — ABNORMAL HIGH (ref 70–99)
Potassium: 4.5 mEq/L (ref 3.5–5.1)
Sodium: 137 mEq/L (ref 135–145)

## 2012-07-14 NOTE — Progress Notes (Signed)
Rodney Hester Date of Birth  11-08-1940 [facility] [address]     [address] [place], [place]  [address] [phone]  Fax  [phone]   Problems: 1. CAD - s/p inferior wall myocardial infarction in 1999-status post stent placement to the right coronary artery. He is also status post stent placement to the first diagonal artery in March, 2000. At that time he had angioplasty to the LAD because of "snow plowing of plaque down the LAD from the 1st diagonal.  2. Hyperlipidemia 3. Kidney stone- left kidney,   History of Present Illness:  72 year old gentleman with a history of coronary artery disease. Status post myocardial infarction in 1998.  He has done very well. He has not had any episodes of angina. He works out on a daily basis.  He had a kidney stone this summer. He eventually required extraction of the stone by Dr. Charlyn Minerva.  [name] feeling quite a bit better and is now back doing his normal workouts.  July 14, 2012:  Rodney Hester is doing well.  No CP or dyspnea.  He has had some plantar fascitis and has not been working out much.  He also has had a kidney stone removed and had hand surgery.     He has returned to work.  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   Current Outpatient Prescriptions on File Prior to Visit  Medication Sig Dispense Refill  . aspirin 81 MG tablet Take 81 mg by mouth daily.       Marland Kitchen atorvastatin (LIPITOR) 40 MG tablet Take 1 tablet (40 mg total) by mouth every evening.  30 tablet  5  . metoprolol (LOPRESSOR) 50 MG tablet Take 0.5 tablets (25 mg total) by mouth 2 (two) times daily.  60 tablet  5  . Multiple Vitamin (MULTIVITAMIN) tablet Take 1 tablet by mouth daily.      . nitroGLYCERIN (NITROSTAT) 0.4 MG SL tablet Place 0.4 mg under the tongue every 5 (five) minutes as needed.       Marland Kitchen telmisartan (MICARDIS) 80 MG tablet Take 1 tablet (80 mg total) by mouth every morning.  30 tablet  5    No current facility-administered medications on file prior to visit.    Allergies  Allergen Reactions  . Lisinopril Hives and Other (See Comments)    WHEN THIS MED IS TAKEN W/ ZOCOR CAUSES HIVES  . Zocor (Simvastatin) Other (See Comments)    WHEN COMBINED W/ LISINOPRIL CAUSES HIVES    Past Medical History  Diagnosis Date  . Hyperlipidemia   . Hypertension   . History of acute inferior wall myocardial infarction 1999-  S/P STENT RCA  . S/P primary angioplasty with coronary stent   . Left ureteral calculus   . Coronary artery disease CARDIOLOGIST- DR Elease Hashimoto    MI 8/99  (09-08-2011  PT DENIES S & S)    Past Surgical History  Procedure Laterality Date  . Trigger finger release  06/02/2011-  LOCAL ONLY    Procedure: MINOR RELEASE TRIGGER FINGER/A-1 PULLEY;  Surgeon: Wyn Forster., MD;  Location: [facility];  Service: Orthopedics;  Laterality: Left;  left ring  . Pulley release right ring finger/ limited synovectomy  12-26-2009  . Extracorporeal shock wave lithotripsy  08-03-2011    LEFT  . Anterior cruciate ligament repair  1970'S    LEFT KNEE  . Cardiovascular [facility]  [facility]  [facility]  PER PT NORMAL  . Coronary angioplasty with stent placement  AUG 1999    STENTING RCA  . Coronary angioplasty  NOV 1999    FIRST DIAGONAL BRANCH  . Coronary angioplasty with stent placement  MAR 2000    STENTING FIRST DIAGONAL FOR RESTENOSIS  AND PCI OF LAD  . Cystoscopy/retrograde/ureteroscopy/stone extraction with basket  09/11/2011    Procedure: CYSTOSCOPY/RETROGRADE/URETEROSCOPY/STONE EXTRACTION WITH BASKET;  Surgeon: Garnett Farm, MD;  Location: [facility];  Service: Urology;  Laterality: Left;    History  Smoking status  . Former Smoker  . Types: Cigarettes  Smokeless tobacco  . Not on file    Comment: PASSIVE SMOKER QUIT >49YRS AGO    History  Alcohol Use  . Yes    Comment: OCCASIONAL    Family History  Problem Relation Age of  Onset  . Heart attack Father     Reviw of Systems:  Reviewed in the HPI.  All other systems are negative.  Physical Exam: BP 130/70  Pulse 64  Ht 5\' 3"  (1.6 m)  Wt 158 lb 6.4 oz (71.85 kg)  BMI 28.07 kg/m2  SpO2 98% The patient is alert and oriented x 3.  The mood and affect are normal.   Skin: warm and dry.  Color is normal.    HEENT:   the sclera are nonicteric.  The mucous membranes are moist.  The carotids are 2+ without bruits.  There is no thyromegaly.  There is no JVD.    Lungs: clear.  The chest wall is non tender.    Heart: regular rate with a normal S1 and S2.  There are no murmurs, gallops, or rubs. The PMI is not displaced.     Abdomen: good bowel sounds.  There is no guarding or rebound.  There is no hepatosplenomegaly or tenderness.  There are no masses.   Extremities:  no clubbing, cyanosis, or edema.  The legs are without rashes.  The distal pulses are intact.   Neuro:  Cranial nerves II - XII are intact.  Motor and sensory functions are intact.    The gait is normal.  ECG:  Assessment / Plan:

## 2012-07-14 NOTE — Assessment & Plan Note (Signed)
Rodney Hester  is  doing well. [name] not having any episodes of chest pain or shortness breath. He has remained active and has returned to work. We will check his fasting lipids, liver enzymes, and basic metabolic profile today. Housing in 6 months for an office visit

## 2012-07-14 NOTE — Patient Instructions (Signed)
Your physician wants you to follow-up in: 6 months  You will receive a reminder letter in the mail two months in advance. If you don't receive a letter, please call our office to schedule the follow-up appointment.   Your physician recommends that you return for a FASTING lipid profile: today and in 6 months   Your physician recommends that you continue on your current medications as directed. Please refer to the Current Medication list given to you today.   

## 2012-07-15 ENCOUNTER — Telehealth: Payer: Self-pay | Admitting: Cardiovascular Disease

## 2012-07-15 NOTE — Telephone Encounter (Signed)
New Problem:    Patient called in returning your call regarding his recent lab results.  Please call back.

## 2012-07-15 NOTE — Telephone Encounter (Signed)
RESULTS REVIEWED

## 2012-08-24 ENCOUNTER — Other Ambulatory Visit: Payer: Self-pay

## 2012-11-12 IMAGING — CR DG ABDOMEN 1V
2 series · 2 of 2 positions shown · non-contrast
Comparison: CT of the abdomen and pelvis 07/27/2011. Plain film
radiographs 07/31/2011 and 09/02/2011 at [HOSPITAL].

CLINICAL DATA: Left ureteral stone.  Preop.

ABDOMEN - 1 VIEW

[t abdomen supine (1 of 2)]
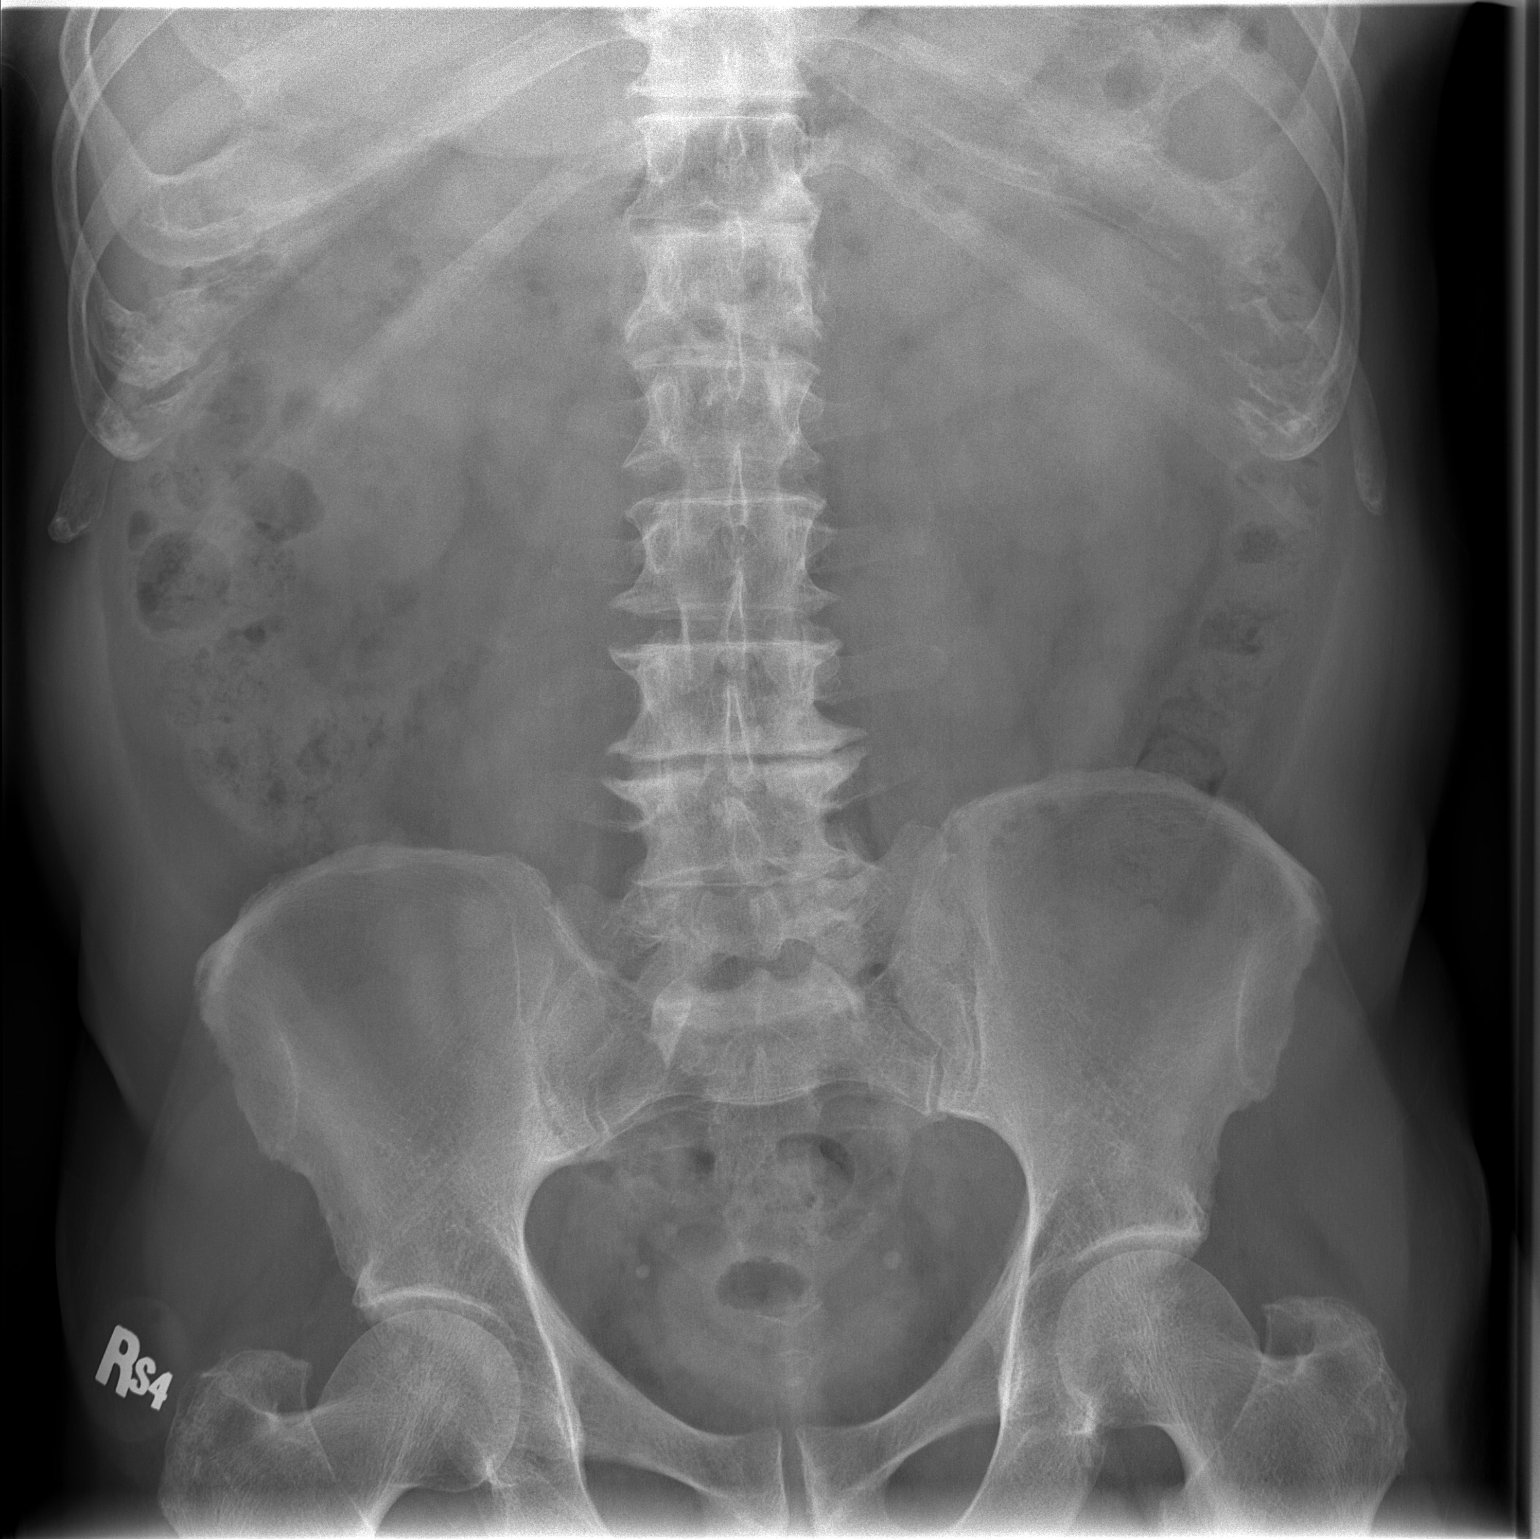

[t abdomen supine (2 of 2)]
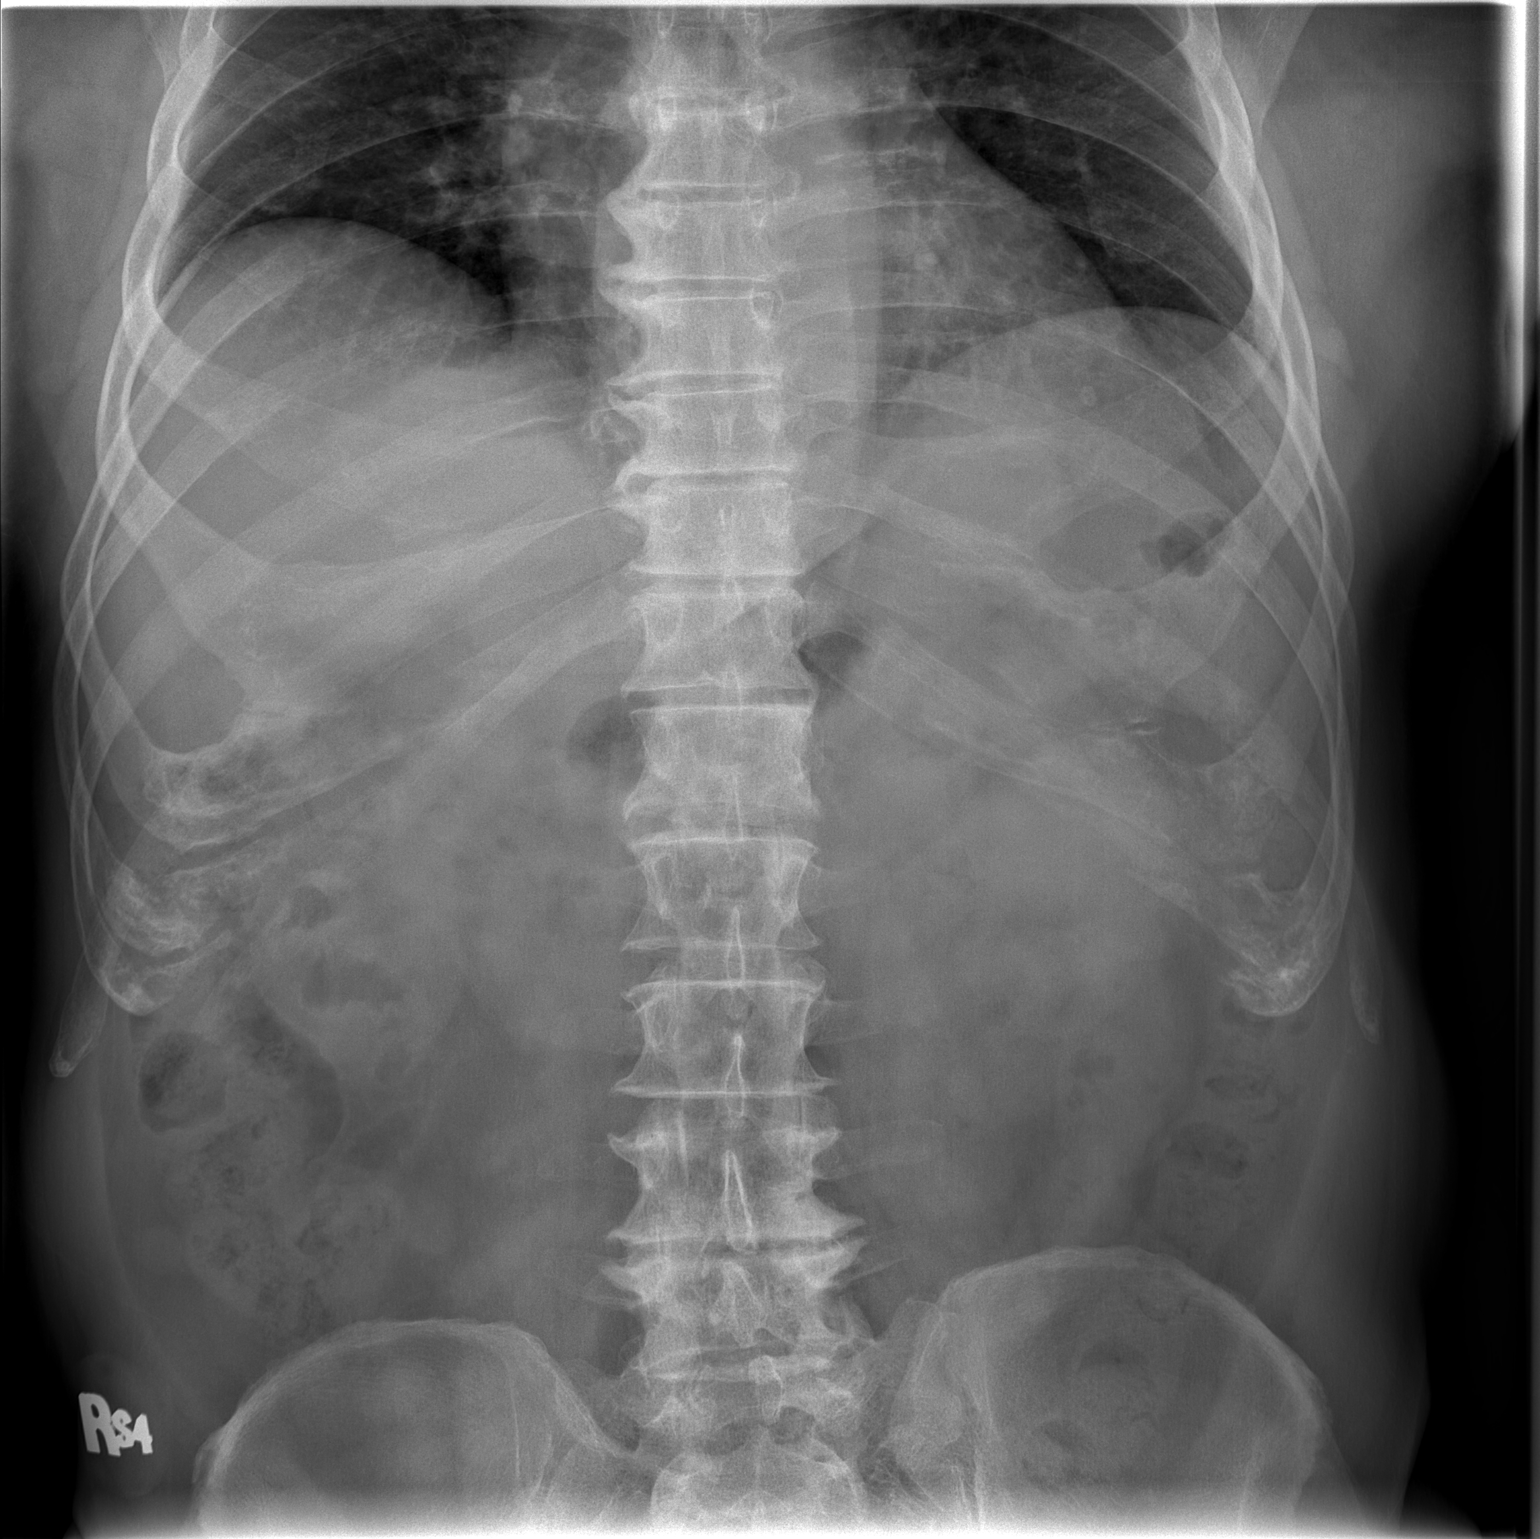

[2 of 2 positions shown; findings below may reference images not displayed]

FINDINGS: A calcified stone previously noted in the left ureter now
projects over the level of the ureteral vesicle junction on the
left.  A phlebolith in the right anatomic pelvis is stable.  No new
calcifications are apparent over the kidneys or expected course of
the ureters bilaterally.  Degenerative changes of the lumbar spine
are most pronounced at L4-5.
IMPRESSION: 1.  Distal left ureteral stone now projects over the left ureteral
vesicle junction.
2.  Degenerative changes in the lower lumbar spine.

## 2012-11-18 ENCOUNTER — Other Ambulatory Visit: Payer: Self-pay | Admitting: Cardiovascular Disease

## 2012-11-24 ENCOUNTER — Other Ambulatory Visit: Payer: Self-pay

## 2012-12-12 ENCOUNTER — Other Ambulatory Visit: Payer: Self-pay | Admitting: Cardiovascular Disease

## 2013-01-02 ENCOUNTER — Other Ambulatory Visit: Payer: Self-pay | Admitting: Cardiovascular Disease

## 2013-01-16 ENCOUNTER — Other Ambulatory Visit: Payer: Self-pay | Admitting: Cardiovascular Disease

## 2013-01-23 ENCOUNTER — Ambulatory Visit (INDEPENDENT_AMBULATORY_CARE_PROVIDER_SITE_OTHER): Payer: BC Managed Care – PPO | Admitting: Cardiovascular Disease

## 2013-01-23 ENCOUNTER — Encounter: Payer: Self-pay | Admitting: Cardiovascular Disease

## 2013-01-23 VITALS — BP 138/78 | HR 69 | Ht 63.0 in | Wt 162.8 lb

## 2013-01-23 DIAGNOSIS — I2510 Atherosclerotic heart disease of native coronary artery without angina pectoris: Secondary | ICD-10-CM

## 2013-01-23 DIAGNOSIS — E785 Hyperlipidemia, unspecified: Secondary | ICD-10-CM | POA: Insufficient documentation

## 2013-01-23 NOTE — Assessment & Plan Note (Addendum)
No angina.  He had his lipids checked in Dr. Jone BasemanGriffin's office approximately one month ago. He will call Dr. Jone BasemanGriffin's office and have those labs faxed over to us.  I will see him again in  6 months.  He is eager to resume his exercise regimine.

## 2013-01-23 NOTE — Patient Instructions (Signed)
Your physician wants you to follow-up in: 6 months  You will receive a reminder letter in the mail two months in advance. If you don't receive a letter, please call our office to schedule the follow-up appointment.  Your physician recommends that you continue on your current medications as directed. Please refer to the Current Medication list given to you today.   Your physician recommends that you return for a FASTING lipid profile: 6 months///IF YOUR PRIMARY CARE PHYSICIAN DOES YOUR FASTING LABS PLEASE PROVIDE US A COPY FOR YOUR CHART.

## 2013-01-23 NOTE — Progress Notes (Signed)
Rodney Hester Date of Birth  July 17, 1940 [facility] / [facility] [address]     [address] [place], [place]  [address] [phone]  Fax  [phone]   Problems: 1. CAD - s/p inferior wall myocardial infarction in 1999-status post stent placement to the right coronary artery. He is also status post stent placement to the first diagonal artery in March, 2000. At that time he had angioplasty to the LAD because of "snow plowing of plaque down the LAD from the 1st diagonal.  2. Hyperlipidemia 3. Kidney stone- left kidney,   History of Present Illness:  73 year old gentleman with a history of coronary artery disease. Status post myocardial infarction in 1998.  He has done very well. He has not had any episodes of angina. He works out on a daily basis.  He had a kidney stone this summer. He eventually required extraction of the stone by Dr. Charlyn Minerva.  [name] feeling quite a bit better and is now back doing his normal workouts.  July 14, 2012:  Laquon is doing well.  No CP or dyspnea.  He has had some plantar fascitis and has not been working out much.  He also has had a kidney stone removed and had hand surgery.     He has returned to work.  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  Jan. 5, 2014:  He has retired (again).  He started back working and was not able to work out and gained weight.    No CP or dyspnea.     Current Outpatient Prescriptions on File Prior to Visit  Medication Sig Dispense Refill  . aspirin 81 MG tablet Take 81 mg by mouth daily.       Marland Kitchen atorvastatin (LIPITOR) 40 MG tablet TAKE ONE TABLET BY MOUTH ONE TIME DAILY IN THE P.M.   30 tablet  4  . metoprolol (LOPRESSOR) 50 MG tablet Take 0.5 tablets (25 mg total) by mouth 2 (two) times daily.  60 tablet  5  . Multiple Vitamin (MULTIVITAMIN) tablet Take 1 tablet by mouth daily.      . nitroGLYCERIN (NITROSTAT) 0.4 MG SL tablet Place 0.4 mg under the tongue every  5 (five) minutes as needed.       Marland Kitchen telmisartan (MICARDIS) 80 MG tablet TAKE ONE TABLET BY MOUTH ONE TIME DAILY   30 tablet  0   No current facility-administered medications on file prior to visit.    Allergies  Allergen Reactions  . Lisinopril Hives and Other (See Comments)    WHEN THIS MED IS TAKEN W/ ZOCOR CAUSES HIVES  . Zocor [Simvastatin] Other (See Comments)    WHEN COMBINED W/ LISINOPRIL CAUSES HIVES    Past Medical History  Diagnosis Date  . Hyperlipidemia   . Hypertension   . History of acute inferior wall myocardial infarction 1999-  S/P STENT RCA  . S/P primary angioplasty with coronary stent   . Left ureteral calculus   . Coronary artery disease CARDIOLOGIST- DR Elease Hashimoto    MI 8/99  (09-08-2011  PT DENIES S & S)    Past Surgical History  Procedure Laterality Date  . Trigger finger release  06/02/2011-  LOCAL ONLY    Procedure: MINOR RELEASE TRIGGER FINGER/A-1 PULLEY;  Surgeon: Wyn Forster., MD;  Location: [facility];  Service: Orthopedics;  Laterality: Left;  left ring  . Pulley release right ring finger/ limited synovectomy  12-26-2009  . Extracorporeal shock  wave lithotripsy  08-03-2011    LEFT  . Anterior cruciate ligament repair  1970'S    LEFT KNEE  . Cardiovascular stress test  2011  DR NAHSER    PER PT NORMAL  . Coronary angioplasty with stent placement  AUG 1999    STENTING RCA  . Coronary angioplasty  NOV 1999    FIRST DIAGONAL BRANCH  . Coronary angioplasty with stent placement  MAR 2000    STENTING FIRST DIAGONAL FOR RESTENOSIS  AND PCI OF LAD  . Cystoscopy/retrograde/ureteroscopy/stone extraction with basket  09/11/2011    Procedure: CYSTOSCOPY/RETROGRADE/URETEROSCOPY/STONE EXTRACTION WITH BASKET;  Surgeon: Garnett FarmMark C Ottelin, MD;  [facility];  Service: Urology;  Laterality: Left;    History  Smoking status  . Former Smoker  . Types: Cigarettes  Smokeless tobacco  . Not on file    Comment: PASSIVE  SMOKER QUIT >36YRS AGO    History  Alcohol Use  . Yes    Comment: OCCASIONAL    Family History  Problem Relation Age of Onset  . Heart attack Father     Reviw of Systems:  Reviewed in the HPI.  All other systems are negative.  Physical Exam: BP 138/78  Pulse 69  Ht 5\' 3"  (1.6 m)  Wt 162 lb 12.8 oz (73.846 kg)  BMI 28.85 kg/m2 The patient is alert and oriented x 3.  The mood and affect are normal.   Skin: warm and dry.  Color is normal.    HEENT:   the sclera are nonicteric.  The mucous membranes are moist.  The carotids are 2+ without bruits.  There is no thyromegaly.  There is no JVD.    Lungs: clear.  The chest wall is non tender.    Heart: regular rate with a normal S1 and S2.  There are no murmurs, gallops, or rubs. The PMI is not displaced.     Abdomen: good bowel sounds.  There is no guarding or rebound.  There is no hepatosplenomegaly or tenderness.  There are no masses.   Extremities:  no clubbing, cyanosis, or edema.  The legs are without rashes.  The distal pulses are intact.   Neuro:  Cranial nerves II - XII are intact.  Motor and sensory functions are intact.    The gait is normal.  ECG: Jan. 5, 2015:  NSR at 5769.  Normal ECG Assessment / Plan:

## 2013-02-20 ENCOUNTER — Other Ambulatory Visit: Payer: Self-pay | Admitting: Cardiovascular Disease

## 2013-02-28 ENCOUNTER — Other Ambulatory Visit: Payer: Self-pay | Admitting: Cardiovascular Disease

## 2013-07-10 ENCOUNTER — Other Ambulatory Visit: Payer: Self-pay | Admitting: Cardiovascular Disease

## 2013-07-28 ENCOUNTER — Other Ambulatory Visit: Payer: Self-pay | Admitting: *Deleted

## 2013-07-28 MED ORDER — METOPROLOL TARTRATE 50 MG PO TABS: ORAL_TABLET | ORAL | Status: DC

## 2013-08-15 ENCOUNTER — Other Ambulatory Visit: Payer: Self-pay | Admitting: Cardiovascular Disease

## 2013-09-04 ENCOUNTER — Other Ambulatory Visit: Payer: Self-pay | Admitting: Cardiovascular Disease

## 2013-09-19 ENCOUNTER — Other Ambulatory Visit: Payer: Self-pay

## 2013-09-19 MED ORDER — TELMISARTAN 80 MG PO TABS: ORAL_TABLET | ORAL | Status: DC

## 2013-10-06 ENCOUNTER — Other Ambulatory Visit: Payer: Self-pay | Admitting: *Deleted

## 2013-10-06 MED ORDER — METOPROLOL TARTRATE 50 MG PO TABS: ORAL_TABLET | ORAL | Status: DC

## 2013-10-17 ENCOUNTER — Ambulatory Visit (INDEPENDENT_AMBULATORY_CARE_PROVIDER_SITE_OTHER): Payer: BC Managed Care – PPO | Admitting: Cardiovascular Disease

## 2013-10-17 ENCOUNTER — Encounter: Payer: Self-pay | Admitting: Cardiovascular Disease

## 2013-10-17 ENCOUNTER — Other Ambulatory Visit: Payer: Self-pay

## 2013-10-17 VITALS — BP 180/80 | HR 74 | Ht 63.0 in | Wt 159.8 lb

## 2013-10-17 DIAGNOSIS — E785 Hyperlipidemia, unspecified: Secondary | ICD-10-CM

## 2013-10-17 DIAGNOSIS — I158 Other secondary hypertension: Secondary | ICD-10-CM

## 2013-10-17 DIAGNOSIS — I10 Essential (primary) hypertension: Secondary | ICD-10-CM | POA: Insufficient documentation

## 2013-10-17 DIAGNOSIS — I2510 Atherosclerotic heart disease of native coronary artery without angina pectoris: Secondary | ICD-10-CM

## 2013-10-17 MED ORDER — TELMISARTAN 80 MG PO TABS: ORAL_TABLET | ORAL | Status: DC

## 2013-10-17 NOTE — Assessment & Plan Note (Signed)
Check fasting labs at his next office visit in 6 months.

## 2013-10-17 NOTE — Patient Instructions (Signed)
Your physician recommends that you continue on your current medications as directed. Please refer to the Current Medication list given to you today.  Your physician wants you to follow-up in: 6 months with Dr. Nahser.  You will receive a reminder letter in the mail two months in advance. If you don't receive a letter, please call our office to schedule the follow-up appointment.  

## 2013-10-17 NOTE — Progress Notes (Signed)
Rodney Hester Date of Birth  Jan 11, 1941 [facility] [address]     [address] [place], [place]  [address] [phone]  Fax  [phone]   Problems: 1. CAD - s/p inferior wall myocardial infarction in 1999-status post stent placement to the right coronary artery. He is also status post stent placement to the first diagonal artery in March, 2000. At that time he had angioplasty to the LAD because of "snow plowing of plaque down the LAD from the 1st diagonal.  2. Hyperlipidemia 3. Kidney stone- left kidney,   History of Present Illness:  73 year old gentleman with a history of coronary artery disease. Status post myocardial infarction in 1998.  He has done very well. He has not had any episodes of angina. He works out on a daily basis.  He had a kidney stone this summer. He eventually required extraction of the stone by Dr. Charlyn Minerva.  [name] feeling quite a bit better and is now back doing his normal workouts.  July 14, 2012:  Rodney Hester is doing well.  No CP or dyspnea.  He has had some plantar fascitis and has not been working out much.  He also has had a kidney stone removed and had hand surgery.     He has returned to work.  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  Jan. 5, 2014:  He has retired (again).  He started back working and was not able to work out and gained weight.    No CP or dyspnea.    Sept. 29, 2015:  Rodney Hester has had a cold and cough for the past 3 months.  Is taking Afrin.  BP is a bit high.   Still battling plantar fascitis.    He has started working again. ( 3rd career)    He has already retired twice.    Current Outpatient Prescriptions on File Prior to Visit  Medication Sig Dispense Refill  . aspirin 81 MG tablet Take 81 mg by mouth daily.       Marland Kitchen atorvastatin (LIPITOR) 40 MG tablet TAKE ONE TABLET BY MOUTH ONCE DAILY IN THE P.M.   30 tablet  3  . metoprolol (LOPRESSOR) 50 MG tablet  TAKE ONE-HALF TABLET BY MOUTH TWICE DAILY  30 tablet  0  . Multiple Vitamin (MULTIVITAMIN) tablet Take 1 tablet by mouth daily.      . nitroGLYCERIN (NITROSTAT) 0.4 MG SL tablet Place 0.4 mg under the tongue every 5 (five) minutes as needed.       Marland Kitchen telmisartan (MICARDIS) 80 MG tablet TAKE ONE TABLET BY MOUTH ONE TIME DAILY  30 tablet  0   No current facility-administered medications on file prior to visit.    Allergies  Allergen Reactions  . Lisinopril Hives and Other (See Comments)    WHEN THIS MED IS TAKEN W/ ZOCOR CAUSES HIVES  . Zocor [Simvastatin] Other (See Comments)    WHEN COMBINED W/ LISINOPRIL CAUSES HIVES    Past Medical History  Diagnosis Date  . Hyperlipidemia   . Hypertension   . History of acute inferior wall myocardial infarction 1999-  S/P STENT RCA  . S/P primary angioplasty with coronary stent   . Left ureteral calculus   . Coronary artery disease CARDIOLOGIST- DR Elease Hashimoto    MI 8/99  (09-08-2011  PT DENIES S & S)    Past Surgical History  Procedure Laterality Date  . Trigger finger release  06/02/2011-  LOCAL ONLY  Procedure: MINOR RELEASE TRIGGER FINGER/A-1 PULLEY;  Surgeon: Wyn Forsterobert V Sypher Jr., MD;  Location: [facility];  Service: Orthopedics;  Laterality: Left;  left ring  . Pulley release right ring finger/ limited synovectomy  12-26-2009  . Extracorporeal shock wave lithotripsy  08-03-2011    LEFT  . Anterior cruciate ligament repair  1970'S    LEFT KNEE  . Cardiovascular stress test  2011  DR Marinus Eicher    PER PT NORMAL  . Coronary angioplasty with stent placement  AUG 1999    STENTING RCA  . Coronary angioplasty  NOV 1999    FIRST DIAGONAL BRANCH  . Coronary angioplasty with stent placement  MAR 2000    STENTING FIRST DIAGONAL FOR RESTENOSIS  AND PCI OF LAD  . Cystoscopy/retrograde/ureteroscopy/stone extraction with basket  09/11/2011    Procedure: CYSTOSCOPY/RETROGRADE/URETEROSCOPY/STONE EXTRACTION WITH BASKET;  Surgeon: Garnett FarmMark C  Ottelin, MD;  [facility] [place] [place];  Service: Urology;  Laterality: Left;    History  Smoking status  . Former Smoker  . Types: Cigarettes  Smokeless tobacco  . Not on file    Comment: PASSIVE SMOKER QUIT >40YRS AGO    History  Alcohol Use  . Yes    Comment: OCCASIONAL    Family History  Problem Relation Age of Onset  . Heart attack Father     Reviw of Systems:  Reviewed in the HPI.  All other systems are negative.  Physical Exam: BP 180/80  Pulse 74  Ht 5\' 3"  (1.6 m)  Wt 159 lb 12.8 oz (72.485 kg)  BMI 28.31 kg/m2 The patient is alert and oriented x 3.  The mood and affect are normal.   Skin: warm and dry.  Color is normal.    HEENT:   the sclera are nonicteric.  The mucous membranes are moist.  The carotids are 2+ without bruits.  There is no thyromegaly.  There is no JVD.    Lungs: clear.  The chest wall is non tender.    Heart: regular rate with a normal S1 and S2.  There are no murmurs, gallops, or rubs. The PMI is not displaced.     Abdomen: good bowel sounds.  There is no guarding or rebound.  There is no hepatosplenomegaly or tenderness.  There are no masses.   Extremities:  no clubbing, cyanosis, or edema.  The legs are without rashes.  The distal pulses are intact.   Neuro:  Cranial nerves II - XII are intact.  Motor and sensory functions are intact.    The gait is normal.  ECG: Jan. 5, 2015:  NSR at 5369.  Normal ECG Assessment / Plan:

## 2013-10-17 NOTE — Assessment & Plan Note (Signed)
His blood pressure has been well-controlled at home. [name] having a lot of pain today. He also has been taking aspirin for his cold. I suspect that his hypertension is due to that.  He'll continue to monitor this on regular basis. He'll call me if his blood pressure stays up.

## 2013-10-17 NOTE — Assessment & Plan Note (Signed)
No angina. Exercising regularly - limited by plantar fascitis. Very stable from a cardiac standpoint.

## 2013-11-03 ENCOUNTER — Other Ambulatory Visit: Payer: Self-pay

## 2013-11-06 ENCOUNTER — Other Ambulatory Visit: Payer: Self-pay | Admitting: Cardiovascular Disease

## 2014-02-05 ENCOUNTER — Other Ambulatory Visit: Payer: Self-pay | Admitting: Cardiovascular Disease

## 2014-03-15 ENCOUNTER — Ambulatory Visit (INDEPENDENT_AMBULATORY_CARE_PROVIDER_SITE_OTHER): Payer: BC Managed Care – PPO | Admitting: Cardiovascular Disease

## 2014-03-15 ENCOUNTER — Encounter: Payer: Self-pay | Admitting: Cardiovascular Disease

## 2014-03-15 VITALS — BP 130/90 | HR 74 | Ht 63.0 in | Wt 160.0 lb

## 2014-03-15 DIAGNOSIS — E785 Hyperlipidemia, unspecified: Secondary | ICD-10-CM

## 2014-03-15 DIAGNOSIS — I2510 Atherosclerotic heart disease of native coronary artery without angina pectoris: Secondary | ICD-10-CM

## 2014-03-15 DIAGNOSIS — I158 Other secondary hypertension: Secondary | ICD-10-CM

## 2014-03-15 NOTE — Progress Notes (Signed)
Cardiology Office Note   Date:  03/15/2014   ID:  Rodney Hester, DOB April 18, 1940, MRN [id number]  PCP:  Lillia Mountain, MD  Cardiologist:   Vesta Mixer, MD   Chief Complaint  Patient presents with  . Coronary Artery Disease   1. CAD - s/p inferior wall myocardial infarction in 1999-status post stent placement to the right coronary artery. He is also status post stent placement to the first diagonal artery in March, 2000. At that time he had angioplasty to the LAD because of "snow plowing of plaque down the LAD from the 1st diagonal.  2. Hyperlipidemia 3. Kidney stone- left kidney,   History of Present Illness:  74 year old gentleman with a history of coronary artery disease. Status post myocardial infarction in 1998. He has done very well. He has not had any episodes of angina. He works out on a daily basis.  He had a kidney stone this summer. He eventually required extraction of the stone by Dr. Charlyn Minerva. [name] feeling quite a bit better and is now back doing his normal workouts.  July 14, 2012:  Rodney Hester is doing well. No CP or dyspnea. He has had some plantar fascitis and has not been working out much. He also has had a kidney stone removed and had hand surgery.   He has returned to work. 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  Jan. 5, 2014:  He has retired (again). He started back working and was not able to work out and gained weight. No CP or dyspnea.   Sept. 29, 2015:  Rodney Hester has had a cold and cough for the past 3 months. Is taking Afrin. BP is a bit high.  Still battling plantar fascitis.   He has started working again. ( 3rd career) He has already retired twice.   Feb. 25, 2015  Rodney Hester is a 74 y.o. male who presents for follow-up of his coronary artery disease. Ajit is doing well. Had nasal surgery Feb. 9.    Had some chest pain after doing some pushs recently.    He thinks it was a  muscular issue.   Has had plantas fascitis.  He thinks it was due to his old orthotics.  His foot pain is better.   Past Medical History  Diagnosis Date  . Hyperlipidemia   . Hypertension   . History of acute inferior wall myocardial infarction 1999-  S/P STENT RCA  . S/P primary angioplasty with coronary stent   . Left ureteral calculus   . Coronary artery disease CARDIOLOGIST- DR Elease Hashimoto    MI 8/99  (09-08-2011  PT DENIES S & S)    Past Surgical History  Procedure Laterality Date  . Trigger finger release  06/02/2011-  LOCAL ONLY    Procedure: MINOR RELEASE TRIGGER FINGER/A-1 PULLEY;  Surgeon: Wyn Forster., MD;  [facility];  Service: Orthopedics;  Laterality: Left;  left ring  . Pulley release right ring finger/ limited synovectomy  12-26-2009  . Extracorporeal shock wave lithotripsy  08-03-2011    LEFT  . Anterior cruciate ligament repair  1970'S    LEFT KNEE  . Cardiovascular stress test  2011  DR Kanyla Omeara    PER PT NORMAL  . Coronary angioplasty with stent placement  AUG 1999    STENTING RCA  . Coronary angioplasty  NOV 1999    FIRST DIAGONAL BRANCH  . Coronary angioplasty with stent placement  MAR 2000  STENTING FIRST DIAGONAL FOR RESTENOSIS  AND PCI OF LAD  . Cystoscopy/retrograde/ureteroscopy/stone extraction with basket  09/11/2011    Procedure: CYSTOSCOPY/RETROGRADE/URETEROSCOPY/STONE EXTRACTION WITH BASKET;  Surgeon: Garnett Farm, MD;  [facility]: [facility];  Service: Urology;  Laterality: Left;     Current Outpatient Prescriptions  Medication Sig Dispense Refill  . aspirin 81 MG tablet Take 81 mg by mouth daily.     Marland Kitchen atorvastatin (LIPITOR) 40 MG tablet TAKE ONE TABLET BY MOUTH ONE TIME DAILY in the evening. 30 tablet 1  . metoprolol (LOPRESSOR) 50 MG tablet TAKE ONE-HALF TABLET BY MOUTH TWICE DAILY  30 tablet 6  . Multiple Vitamin (MULTIVITAMIN) tablet Take 1 tablet by mouth daily.    . nitroGLYCERIN (NITROSTAT) 0.4  MG SL tablet Place 0.4 mg under the tongue every 5 (five) minutes as needed.     Marland Kitchen telmisartan (MICARDIS) 80 MG tablet TAKE ONE TABLET BY MOUTH ONE TIME DAILY 30 tablet 6   No current facility-administered medications for this visit.    Allergies:   Lisinopril and Zocor    Social History:  The patient  reports that he has quit smoking. His smoking use included Cigarettes. He does not have any smokeless tobacco history on file. He reports that he drinks alcohol. He reports that he does not use illicit drugs.   Family History:  The patient's family history includes Heart attack in his father.    ROS:  Please see the history of present illness.    Review of Systems: Constitutional:  denies fever, chills, diaphoresis, appetite change and fatigue.  HEENT: denies photophobia, eye pain, redness, hearing loss, ear pain, congestion, sore throat, rhinorrhea, sneezing, neck pain, neck stiffness and tinnitus.  Respiratory: denies SOB, DOE, cough, chest tightness, and wheezing.  Cardiovascular: denies chest pain, palpitations and leg swelling.  Gastrointestinal: denies nausea, vomiting, abdominal pain, diarrhea, constipation, blood in stool.  Genitourinary: denies dysuria, urgency, frequency, hematuria, flank pain and difficulty urinating.  Musculoskeletal: denies  myalgias, back pain, joint swelling, arthralgias and gait problem.   Skin: denies pallor, rash and wound.  Neurological: denies dizziness, seizures, syncope, weakness, light-headedness, numbness and headaches.   Hematological: denies adenopathy, easy bruising, personal or family bleeding history.  Psychiatric/ Behavioral: denies suicidal ideation, mood changes, confusion, nervousness, sleep disturbance and agitation.       All other systems are reviewed and negative.    PHYSICAL EXAM: VS:  BP 130/90 mmHg  Pulse 74  Ht  (1.6 m)  Wt 160 lb (72.576 kg)  BMI 28.35 kg/m2 , BMI Body mass index is 28.35 kg/(m^2). GEN: Well  nourished, well developed, in no acute distress HEENT: normal Neck: no JVD, carotid bruits, or masses Cardiac: RRR; no murmurs, rubs, or gallops,no edema  Respiratory:  clear to auscultation bilaterally, normal work of breathing GI: soft, nontender, nondistended, + BS MS: no deformity or atrophy Skin: warm and dry, no rash Neuro:  Strength and sensation are intact Psych: normal   EKG:  EKG is ordered today. The ekg ordered today demonstrates NSR at 74.  Normal ECG   Recent Labs: No results found for requested labs within last 365 days.    Lipid Panel    Component Value Date/Time   CHOL 139 07/14/2012 1136   TRIG 53.0 07/14/2012 1136   HDL 56.10 07/14/2012 1136   CHOLHDL 2 07/14/2012 1136   VLDL 10.6 07/14/2012 1136   LDLCALC 72 07/14/2012 1136      Wt Readings from Last 3 Encounters:  03/15/14 160 lb (72.576 kg)  10/17/13 159 lb 12.8 oz (72.485 kg)  01/23/13 162 lb 12.8 oz (73.846 kg)      Other studies Reviewed: Additional studies/ records that were reviewed today include: . Review of the above records demonstrates:    ASSESSMENT AND PLAN:  1. CAD - s/p inferior wall myocardial infarction in 1999-status post stent placement to the right coronary artery. He is also status post stent placement to the first diagonal artery in March, 2000. At that time he had angioplasty to the LAD because of "snow plowing of plaque down the LAD from the 1st diagonal. He has had some MSK pain but no CP with walking.  Will hold off and not order a stress test at this time but he will call if he has any additional problems.   2. Hyperlipidemia - Continue same medications. His last lipids were with his medical doctor and his lipid levels look good.  3. Kidney stone- left kidney,     Current medicines are reviewed at length with the patient today.  The patient does not have concerns regarding medicines.  The following changes have been made:  no change   Disposition:   FU with me  in 6 months     Signed, Shedrick Sarli, Deloris PingPhilip J, MD  03/15/2014 10:27 AM    [facility] [address], [place], [place]  [address] Phone: [phone]; Fax: [phone]

## 2014-03-15 NOTE — Patient Instructions (Signed)

## 2014-04-10 ENCOUNTER — Other Ambulatory Visit: Payer: Self-pay | Admitting: Cardiovascular Disease

## 2014-04-26 ENCOUNTER — Ambulatory Visit: Payer: BC Managed Care – PPO

## 2014-04-26 ENCOUNTER — Ambulatory Visit (INDEPENDENT_AMBULATORY_CARE_PROVIDER_SITE_OTHER): Payer: BC Managed Care – PPO

## 2014-04-26 ENCOUNTER — Ambulatory Visit (INDEPENDENT_AMBULATORY_CARE_PROVIDER_SITE_OTHER): Payer: BC Managed Care – PPO | Admitting: Podiatry

## 2014-04-26 DIAGNOSIS — M79672 Pain in left foot: Secondary | ICD-10-CM

## 2014-04-26 DIAGNOSIS — M7662 Achilles tendinitis, left leg: Secondary | ICD-10-CM

## 2014-04-26 DIAGNOSIS — M79671 Pain in right foot: Secondary | ICD-10-CM

## 2014-04-26 DIAGNOSIS — M7661 Achilles tendinitis, right leg: Secondary | ICD-10-CM | POA: Diagnosis not present

## 2014-04-26 MED ORDER — TRIAMCINOLONE ACETONIDE 10 MG/ML IJ SUSP
10.0000 mg | Freq: Once | INTRAMUSCULAR | Status: AC
Start: 2014-04-26 — End: 2014-04-26
  Administered 2014-04-26: 10 mg

## 2014-04-26 MED ORDER — MELOXICAM 15 MG PO TABS: 15.0000 mg | ORAL_TABLET | Freq: Every day | ORAL | Status: DC

## 2014-04-26 NOTE — Progress Notes (Signed)
   Subjective:    Patient ID: Rodney Hester, male    DOB: 07-Nov-1940, 74 y.o.   MRN: [id number]  HPIBoth heels have been hurting about 8 months     Review of Systems  Constitutional: Positive for activity change.  Hematological: Bruises/bleeds easily.       Objective:   Physical Exam        Assessment & Plan:

## 2014-04-29 NOTE — Progress Notes (Signed)
Subjective:     Patient ID: Rodney Hester, male   DOB: 16-Jul-1940, 74 y.o.   MRN: [id number]  HPI patient presents stating I have had pain on the bottom of my heels and back of my heels for a long period of time. States that he has had orthotics but they're starting to wear out and knows he needs a new pair   Review of Systems  All other systems reviewed and are negative.      Objective:   Physical Exam  Cardiovascular: Intact distal pulses.   Musculoskeletal: Normal range of motion.  Neurological: He is alert.  Skin: Skin is warm.  Nursing note and vitals reviewed.  neurovascular status found to be intact with muscle strength adequate of the extensors flexors and range of motion within normal limits subtalar midtarsal joint. Patient's found to have mild equinus condition but good Achilles tendon function and has moderate discomfort posterior aspect heel and discomfort on the plantar aspect of the right heel at the insertional point tendon into the calcaneus. Noted to have good digital perfusion and well oriented 3     Assessment:     Plantar fasciitis acute nature right over left foot. Also noted to have inflammation of a mild to moderate nature posterior heel region bilateral    Plan:     H&P done and condition reviewed. Reviewed x-rays and reviewed physical therapy for condition and injected the plantar heel right 3 Milligan Kenalog 5 mill grams Xylocaine and went ahead and scanned for custom orthotic devices. Reappoint when returned

## 2014-05-17 ENCOUNTER — Other Ambulatory Visit: Payer: Self-pay | Admitting: Cardiovascular Disease

## 2014-05-17 ENCOUNTER — Encounter: Payer: Self-pay | Admitting: Podiatry

## 2014-05-17 ENCOUNTER — Ambulatory Visit (INDEPENDENT_AMBULATORY_CARE_PROVIDER_SITE_OTHER): Payer: BC Managed Care – PPO | Admitting: Podiatry

## 2014-05-17 VITALS — BP 133/78 | HR 72 | Resp 12

## 2014-05-17 DIAGNOSIS — M722 Plantar fascial fibromatosis: Secondary | ICD-10-CM | POA: Diagnosis not present

## 2014-05-17 MED ORDER — TRIAMCINOLONE ACETONIDE 10 MG/ML IJ SUSP
10.0000 mg | Freq: Once | INTRAMUSCULAR | Status: AC
  Administered 2014-05-17: 10 mg

## 2014-05-17 NOTE — Progress Notes (Signed)
Subjective:     Patient ID: Rodney Hester, male   DOB: May 31, 1940, 74 y.o.   MRN: [id number]  HPI patient presents stating I'm still having pain in the bottom of the right heel but improved from previously but still sore when pressed   Review of Systems     Objective:   Physical Exam Neurovascular status intact no change in health history with discomfort still plantar heel right at the insertional point of the tendon the calcaneus    Assessment:     Plantar fasciitis right with inflammation and fluid around the medial band    Plan:     Reviewed condition and at this time reinjected the plantar fascia 3 mg Kenalog 5 mg Xylocaine and instructed on physical therapy supportive shoe dispensed orthotics and reappoint as needed

## 2014-06-08 ENCOUNTER — Other Ambulatory Visit: Payer: Self-pay | Admitting: Cardiovascular Disease

## 2014-07-16 ENCOUNTER — Other Ambulatory Visit: Payer: Self-pay

## 2014-09-10 ENCOUNTER — Encounter: Payer: Self-pay | Admitting: Podiatry

## 2014-09-10 ENCOUNTER — Ambulatory Visit (INDEPENDENT_AMBULATORY_CARE_PROVIDER_SITE_OTHER): Payer: BC Managed Care – PPO | Admitting: Podiatry

## 2014-09-10 VITALS — BP 155/75 | HR 77 | Resp 16

## 2014-09-10 DIAGNOSIS — M79671 Pain in right foot: Secondary | ICD-10-CM

## 2014-09-10 DIAGNOSIS — M722 Plantar fascial fibromatosis: Secondary | ICD-10-CM

## 2014-09-10 MED ORDER — TRIAMCINOLONE ACETONIDE 10 MG/ML IJ SUSP
10.0000 mg | Freq: Once | INTRAMUSCULAR | Status: AC
  Administered 2014-09-10: 10 mg

## 2014-09-10 NOTE — Patient Instructions (Signed)

## 2014-09-10 NOTE — Progress Notes (Signed)
Subjective:     Patient ID: Rodney Hester, male   DOB: [birth year], 74 y.o.   MRN: [id number]  HPI patient states I'm still having a lot of pain in the bottom of my right heel and when I try to exercise it seems to flareup   Review of Systems     Objective:   Physical Exam Neurovascular status intact with muscle strength adequate range of motion within normal limits and patient noted to have quite a bit of discomfort in the plantar aspect of the right heel just distal to the insertion into the calcaneus with moderate depression of the arch noted    Assessment:     Continued plantar fasciitis right which may be due to certain types of shoes that he wears    Plan:     Injected the right plantar fascia 3 mg Kenalog 5 mg Xylocaine dispensed night splint and dispensed information on possible shockwave therapy. Patient will continue home treatments and we will decide what else may be appropriate for him

## 2014-10-04 ENCOUNTER — Other Ambulatory Visit: Payer: Self-pay | Admitting: Cardiovascular Disease

## 2014-10-08 ENCOUNTER — Ambulatory Visit (INDEPENDENT_AMBULATORY_CARE_PROVIDER_SITE_OTHER): Payer: BC Managed Care – PPO | Admitting: Podiatry

## 2014-10-08 VITALS — BP 156/81 | HR 79 | Resp 16

## 2014-10-08 DIAGNOSIS — M722 Plantar fascial fibromatosis: Secondary | ICD-10-CM | POA: Diagnosis not present

## 2014-10-08 NOTE — Progress Notes (Signed)
Subjective:     Patient ID: Rodney Hester, male   DOB: July 20, 1940, 74 y.o.   MRN: [id number]  HPI patient states I'm still sore but I've improved over where I was previously   Review of Systems     Objective:   Physical Exam Neurovascular status intact muscle strength adequate range of motion within normal limits with significant diminishment of discomfort plantar aspect heel left and right with mild discomfort right and patient has begin to exercise again at this time    Assessment:     Improving plantar fasciitis with symptoms still present but moderating    Plan:     Spent a great deal of time with him reviewing the causes of this and conservative treatments that we'll be continued. Also he will gradually increase activity levels and I explained the procedures that we will do to do this and I spent a great of time on that education also. Patient is discharge will reappoint as needed

## 2014-11-01 ENCOUNTER — Other Ambulatory Visit: Payer: Self-pay | Admitting: Cardiovascular Disease

## 2014-11-08 ENCOUNTER — Other Ambulatory Visit: Payer: Self-pay | Admitting: Cardiovascular Disease

## 2014-12-21 ENCOUNTER — Ambulatory Visit (INDEPENDENT_AMBULATORY_CARE_PROVIDER_SITE_OTHER): Payer: BC Managed Care – PPO | Admitting: Cardiovascular Disease

## 2014-12-21 ENCOUNTER — Encounter: Payer: Self-pay | Admitting: Cardiovascular Disease

## 2014-12-21 VITALS — BP 156/82 | HR 71 | Ht 63.0 in | Wt 159.0 lb

## 2014-12-21 DIAGNOSIS — I2510 Atherosclerotic heart disease of native coronary artery without angina pectoris: Secondary | ICD-10-CM | POA: Diagnosis not present

## 2014-12-21 DIAGNOSIS — E785 Hyperlipidemia, unspecified: Secondary | ICD-10-CM | POA: Diagnosis not present

## 2014-12-21 DIAGNOSIS — I10 Essential (primary) hypertension: Secondary | ICD-10-CM | POA: Diagnosis not present

## 2014-12-21 LAB — LIPID PANEL
CHOLESTEROL: 130 mg/dL (ref 125–200)
HDL: 51 mg/dL (ref >=40)
LDL CALC: 67 mg/dL (ref <130)
TRIGLYCERIDES: 61 mg/dL (ref <150)
Total CHOL/HDL Ratio: 2.5 Ratio (ref <=5.0)
VLDL: 12 mg/dL (ref <30)

## 2014-12-21 LAB — COMPREHENSIVE METABOLIC PANEL
ALK PHOS: 67 U/L (ref 40–115)
ALT: 52 U/L — ABNORMAL HIGH (ref 9–46)
AST: 33 U/L (ref 10–35)
Albumin: 4 g/dL (ref 3.6–5.1)
BUN: 16 mg/dL (ref 7–25)
CHLORIDE: 106 mmol/L (ref 98–110)
CO2: 28 mmol/L (ref 20–31)
Calcium: 9.1 mg/dL (ref 8.6–10.3)
Creat: 0.87 mg/dL (ref 0.70–1.18)
Glucose, Bld: 126 mg/dL — ABNORMAL HIGH (ref 65–99)
POTASSIUM: 5 mmol/L (ref 3.5–5.3)
Sodium: 142 mmol/L (ref 135–146)
Total Bilirubin: 0.9 mg/dL (ref 0.2–1.2)
Total Protein: 6.9 g/dL (ref 6.1–8.1)

## 2014-12-21 NOTE — Progress Notes (Signed)
Cardiology Office Note   Date:  12/21/2014   ID:  Rodney Hester, DOB 03-18-1940, MRN [id number]  PCP:  Lillia MountainGRIFFIN,[name] JOSEPH, MD  Cardiologist:   Vesta MixerNahser, Kaycee Mcgaugh J, MD   Chief Complaint  Patient presents with  . Follow-up  . Coronary Artery Disease   1. CAD - s/p inferior wall myocardial infarction in 1999-status post stent placement to the right coronary artery. He is also status post stent placement to the first diagonal artery in March, 2000. At that time he had angioplasty to the LAD because of "snow plowing of plaque down the LAD from the 1st diagonal.  2. Hyperlipidemia 3. Kidney stone- left kidney,   History of Present Illness:  74 year old gentleman with a history of coronary artery disease. Status post myocardial infarction in 1998. He has done very well. He has not had any episodes of angina. He works out on a daily basis.  He had a kidney stone this summer. He eventually required extraction of the stone by Dr. Charlyn MinervaMark Ottlin. [name] feeling quite a bit better and is now back doing his normal workouts.  July 14, 2012:  Rodney Hester is doing well. No CP or dyspnea. He has had some plantar fascitis and has not been working out much. He also has had a kidney stone removed and had hand surgery.   He has returned to work. 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  Jan. 5, 2014:  He has retired (again). He started back working and was not able to work out and gained weight. No CP or dyspnea.   Sept. 29, 2015:  Rodney Hester has had a cold and cough for the past 3 months. Is taking Afrin. BP is a bit high.  Still battling plantar fascitis.   He has started working again. ( 3rd career) He has already retired twice.   Feb. 25, 2015  Rodney Hester is a 74 y.o. male who presents for follow-up of his coronary artery disease. Rodney Hester is doing well. Had nasal surgery Feb. 9.    Had some chest pain after doing some pushs recently.    He  thinks it was a muscular issue.   Has had plantas fascitis.  He thinks it was due to his old orthotics.  His foot pain is better.   Dec. 2, 2016:    Rodney Hester is doing ok from a cardiac standpoint Has had achilles tendonitis.     Past Medical History  Diagnosis Date  . Hyperlipidemia   . Hypertension   . History of acute inferior wall myocardial infarction 1999-  S/P STENT RCA  . S/P primary angioplasty with coronary stent   . Left ureteral calculus   . Coronary artery disease CARDIOLOGIST- DR Elease HashimotoNAHSER    MI 8/99  (09-08-2011  PT DENIES S & S)    Past Surgical History  Procedure Laterality Date  . Trigger finger release  06/02/2011-  LOCAL ONLY    Procedure: MINOR RELEASE TRIGGER FINGER/A-1 PULLEY;  Surgeon: Wyn Forsterobert V Sypher Jr., MD;  [facility]  [facility];  Service: Orthopedics;  Laterality: Left;  left ring  . Pulley release right ring finger/ limited synovectomy  12-26-2009  . Extracorporeal shock wave lithotripsy  08-03-2011    LEFT  . Anterior cruciate ligament repair  1970'S    LEFT KNEE  . Cardiovascular stress test  2011  DR Kason Benak    PER PT NORMAL  . Coronary angioplasty with stent placement  AUG 1999    STENTING RCA  .  Coronary angioplasty  NOV 1999    FIRST DIAGONAL BRANCH  . Coronary angioplasty with stent placement  MAR 2000    STENTING FIRST DIAGONAL FOR RESTENOSIS  AND PCI OF LAD  . Cystoscopy/retrograde/ureteroscopy/stone extraction with basket  09/11/2011    Procedure: CYSTOSCOPY/RETROGRADE/URETEROSCOPY/STONE EXTRACTION WITH BASKET;  Surgeon: Garnett Farm, MD;  Location: [facility];  Service: Urology;  Laterality: Left;     Current Outpatient Prescriptions  Medication Sig Dispense Refill  . aspirin 81 MG tablet Take 81 mg by mouth daily.     Marland Kitchen atorvastatin (LIPITOR) 40 MG tablet TAKE ONE TABLET BY MOUTH ONE TIME DAILY IN THE P.M. 30 tablet 1  . metoprolol (LOPRESSOR) 50 MG tablet TAKE ONE-HALF TABLET BY MOUTH TWICE DAILY 30  tablet 1  . Multiple Vitamin (MULTIVITAMIN) tablet Take 1 tablet by mouth daily.    . nitroGLYCERIN (NITROSTAT) 0.4 MG SL tablet Place 0.4 mg under the tongue every 5 (five) minutes as needed.     Marland Kitchen telmisartan (MICARDIS) 80 MG tablet TAKE ONE TABLET BY MOUTH ONE TIME DAILY 30 tablet 1   No current facility-administered medications for this visit.    Allergies:   Lisinopril and Zocor    Social History:  The patient  reports that he has quit smoking. His smoking use included Cigarettes. He does not have any smokeless tobacco history on file. He reports that he drinks alcohol. He reports that he does not use illicit drugs.   Family History:  The patient's family history includes Heart attack in his father.    ROS:  Please see the history of present illness.    Review of Systems: Constitutional:  denies fever, chills, diaphoresis, appetite change and fatigue.  HEENT: denies photophobia, eye pain, redness, hearing loss, ear pain, congestion, sore throat, rhinorrhea, sneezing, neck pain, neck stiffness and tinnitus.  Respiratory: denies SOB, DOE, cough, chest tightness, and wheezing.  Cardiovascular: denies chest pain, palpitations and leg swelling.  Gastrointestinal: denies nausea, vomiting, abdominal pain, diarrhea, constipation, blood in stool.  Genitourinary: denies dysuria, urgency, frequency, hematuria, flank pain and difficulty urinating.  Musculoskeletal: denies  myalgias, back pain, joint swelling, arthralgias and gait problem.   Skin: denies pallor, rash and wound.  Neurological: denies dizziness, seizures, syncope, weakness, light-headedness, numbness and headaches.   Hematological: denies adenopathy, easy bruising, personal or family bleeding history.  Psychiatric/ Behavioral: denies suicidal ideation, mood changes, confusion, nervousness, sleep disturbance and agitation.       All other systems are reviewed and negative.    PHYSICAL EXAM: VS:  BP 156/82 mmHg  Pulse 71   Ht  (1.6 m)  Wt 159 lb (72.122 kg)  BMI 28.17 kg/m2 , BMI Body mass index is 28.17 kg/(m^2). GEN: Well nourished, well developed, in no acute distress HEENT: normal Neck: no JVD, carotid bruits, or masses Cardiac: RRR; no murmurs, rubs, or gallops,no edema  Respiratory:  clear to auscultation bilaterally, normal work of breathing GI: soft, nontender, nondistended, + BS MS: no deformity or atrophy Skin: warm and dry, no rash Neuro:  Strength and sensation are intact Psych: normal   EKG:  EKG is ordered today. The ekg ordered today demonstrates NSR at 71.  Normal ECG    Recent Labs: No results found for requested labs within last 365 days.    Lipid Panel    Component Value Date/Time   CHOL 139 07/14/2012 1136   TRIG 53.0 07/14/2012 1136   HDL 56.10 07/14/2012 1136   CHOLHDL 2 07/14/2012  1136   VLDL 10.6 07/14/2012 1136   LDLCALC 72 07/14/2012 1136      Wt Readings from Last 3 Encounters:  12/21/14 159 lb (72.122 kg)  03/15/14 160 lb (72.576 kg)  10/17/13 159 lb 12.8 oz (72.485 kg)      Other studies Reviewed: Additional studies/ records that were reviewed today include: . Review of the above records demonstrates:    ASSESSMENT AND PLAN:  1. CAD - s/p inferior wall myocardial infarction in 1999-status post stent placement to the right coronary artery. He is also status post stent placement to the first diagonal artery in March, 2000. At that time he had angioplasty to the LAD because of "snow plowing of plaque down the LAD from the 1st diagonal.  Doing well.   2. Hyperlipidemia - Continue same medications. Check lipids , liver enz, and BMP today   3. Kidney stone- left kidney,   Current medicines are reviewed at length with the patient today.  The patient does not have concerns regarding medicines.  The following changes have been made:  no change   Disposition:   FU with me in 6 months     Signed, Huntington Leverich, Deloris Ping, MD  12/21/2014 9:29 AM      [facility] [address] [place], [place], [place]  [address] Phone: [phone]; Fax: [phone]

## 2014-12-21 NOTE — Patient Instructions (Signed)
Medication Instructions:  Your physician recommends that you continue on your current medications as directed. Please refer to the Current Medication list given to you today.   Labwork: TODAY - cholesterol, liver, basic metabolic panel   Testing/Procedures: None Ordered   Follow-Up: Your physician wants you to follow-up in: 6 months with Dr. Nahser.  You will receive a reminder letter in the mail two months in advance. If you don't receive a letter, please call our office to schedule the follow-up appointment.   If you need a refill on your cardiac medications before your next appointment, please call your pharmacy.   Thank you for choosing [facility]! Tate Zagal, RN [phone]   

## 2014-12-30 ENCOUNTER — Other Ambulatory Visit: Payer: Self-pay | Admitting: Cardiovascular Disease

## 2015-01-03 ENCOUNTER — Other Ambulatory Visit: Payer: Self-pay | Admitting: Cardiovascular Disease

## 2015-03-08 ENCOUNTER — Encounter: Payer: Self-pay | Admitting: Podiatry

## 2015-03-08 ENCOUNTER — Ambulatory Visit (INDEPENDENT_AMBULATORY_CARE_PROVIDER_SITE_OTHER): Payer: BC Managed Care – PPO | Admitting: Podiatry

## 2015-03-08 VITALS — Resp 16

## 2015-03-08 DIAGNOSIS — M7661 Achilles tendinitis, right leg: Secondary | ICD-10-CM | POA: Diagnosis not present

## 2015-03-08 DIAGNOSIS — M7662 Achilles tendinitis, left leg: Secondary | ICD-10-CM

## 2015-03-08 NOTE — Progress Notes (Signed)
Subjective:     Patient ID: Rodney Hester, male   DOB: 1940-11-17, 75 y.o.   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 I've recommended shockwave therapy and reviewed E Pat therapy for the Achilles tendon and I dispensed an air fracture walker to completely immobilize the posterior heel at this time. Patient will be seen back to recheck

## 2015-03-12 ENCOUNTER — Telehealth: Payer: Self-pay | Admitting: *Deleted

## 2015-03-12 NOTE — Telephone Encounter (Signed)
Pt states he had discuss the shockwave with Dr. Charlsie Merles, since had an achilles tendon problem for over a year.  Pt asked if it was a multi-step process, if he had a co-pay each visit, and if it was $600.00 for each foot.  I told pt it was mult-step, no co-pay, and $600.00 for [facility] [place] [facility] foot.  Pt states he would like to make an appt, I told him I would have the schedulers call him tomorrow.

## 2015-03-18 ENCOUNTER — Ambulatory Visit (INDEPENDENT_AMBULATORY_CARE_PROVIDER_SITE_OTHER): Payer: BC Managed Care – PPO | Admitting: Podiatry

## 2015-03-18 DIAGNOSIS — M7661 Achilles tendinitis, right leg: Secondary | ICD-10-CM

## 2015-03-18 DIAGNOSIS — M7662 Achilles tendinitis, left leg: Secondary | ICD-10-CM

## 2015-03-18 NOTE — Progress Notes (Signed)
Subjective:     Patient ID: Rodney Hester, male   DOB: [birth year], 75 y.o.   MRN: [id number]  HPI patient presents for shockwave therapy medial Achilles tendon right   Review of Systems     Objective:   Physical Exam Neurovascular status unchanged with exquisite discomfort medial side right Achilles tendon    Assessment:     Achilles tendinitis right with inflammation    Plan:     Shockwave administered 3000 shocks 2.7 intensity 17 frequency and tolerated well

## 2015-03-25 ENCOUNTER — Encounter: Payer: Self-pay | Admitting: Podiatry

## 2015-03-25 ENCOUNTER — Ambulatory Visit (INDEPENDENT_AMBULATORY_CARE_PROVIDER_SITE_OTHER): Payer: BC Managed Care – PPO | Admitting: Podiatry

## 2015-03-25 DIAGNOSIS — M7662 Achilles tendinitis, left leg: Secondary | ICD-10-CM

## 2015-03-25 DIAGNOSIS — M7661 Achilles tendinitis, right leg: Secondary | ICD-10-CM

## 2015-03-25 DIAGNOSIS — M79671 Pain in right foot: Secondary | ICD-10-CM

## 2015-03-25 NOTE — Progress Notes (Signed)
Subjective:     Patient ID: Rodney Hester, male   DOB: 05/14/1940, 75 y.o.   MRN: [id number]  HPI patient presents for shockwave therapy medial Achilles tendon right, he states he has noticed a improvement in pain the next day   Review of Systems     Objective:   Physical Exam Neurovascular status unchanged with exquisite discomfort medial side right Achilles tendon    Assessment:     Achilles tendinitis right with inflammation, improving    Plan:     Shockwave administered 3000 shocks 3.6 intensity 17 frequency and tolerated well

## 2015-03-27 NOTE — Progress Notes (Signed)
Subjective:     Patient ID: Rodney Hester, male   DOB: Mar 28, 1940, 75 y.o.   MRN: [id number]  HPI patient states he has improvement in his heel   Review of Systems     Objective:   Physical Exam Neurovascular status intact with posterior heel pain right improving    Assessment:     Achilles tendinitis right improving    Plan:     Shockwave administered 3000 shocks 3.6 intensity 17 frequency

## 2015-04-01 ENCOUNTER — Ambulatory Visit (INDEPENDENT_AMBULATORY_CARE_PROVIDER_SITE_OTHER): Payer: BC Managed Care – PPO | Admitting: Podiatry

## 2015-04-01 DIAGNOSIS — M7662 Achilles tendinitis, left leg: Secondary | ICD-10-CM

## 2015-04-01 DIAGNOSIS — M7661 Achilles tendinitis, right leg: Secondary | ICD-10-CM

## 2015-04-03 NOTE — Progress Notes (Signed)
Subjective:     Patient ID: Rodney Hester, male   DOB: 1940/11/17, 75 y.o.   MRN: [id number]  HPI patient states I seem to be improving   Review of Systems     Objective:   Physical Exam Neurovascular status intact with improvement in the posterior heel with discomfort still noted but quite a bit better than previously    Assessment:     Achilles tendinitis improving    Plan:     Shockwave administered today tolerated well and approximate 3.0 17 frequency 3000 shocks and reappoint to recheck in 4 weeks

## 2015-04-29 ENCOUNTER — Ambulatory Visit (INDEPENDENT_AMBULATORY_CARE_PROVIDER_SITE_OTHER): Payer: BC Managed Care – PPO | Admitting: Podiatry

## 2015-04-29 DIAGNOSIS — M7661 Achilles tendinitis, right leg: Secondary | ICD-10-CM

## 2015-04-29 DIAGNOSIS — M7662 Achilles tendinitis, left leg: Secondary | ICD-10-CM

## 2015-04-30 NOTE — Progress Notes (Signed)
Subjective:     Patient ID: Rodney Hester, male   DOB: [birth year], 75 y.o.   MRN: [id number]  HPI patient states doing quite a bit better   Review of Systems     Objective:   Physical Exam Neurovascular status intact with patient having good healing of the Achilles tendon site right with shockwave    Assessment:     Doing well with shockwave    Plan:     Begin shockwave today 3000 shocks 4.0 intensity 17 frequency and [facility]

## 2015-06-20 ENCOUNTER — Encounter: Payer: Self-pay | Admitting: Cardiovascular Disease

## 2015-06-20 ENCOUNTER — Ambulatory Visit (INDEPENDENT_AMBULATORY_CARE_PROVIDER_SITE_OTHER): Payer: BC Managed Care – PPO | Admitting: Cardiovascular Disease

## 2015-06-20 VITALS — BP 136/74 | HR 70 | Ht 63.0 in | Wt 155.8 lb

## 2015-06-20 DIAGNOSIS — I2510 Atherosclerotic heart disease of native coronary artery without angina pectoris: Secondary | ICD-10-CM | POA: Diagnosis not present

## 2015-06-20 DIAGNOSIS — E785 Hyperlipidemia, unspecified: Secondary | ICD-10-CM

## 2015-06-20 LAB — LIPID PANEL
CHOL/HDL RATIO: 2.6 ratio (ref <=5.0)
CHOLESTEROL: 155 mg/dL (ref 125–200)
HDL: 59 mg/dL (ref >=40)
LDL Cholesterol: 79 mg/dL (ref <130)
TRIGLYCERIDES: 87 mg/dL (ref <150)
VLDL: 17 mg/dL (ref <30)

## 2015-06-20 LAB — COMPREHENSIVE METABOLIC PANEL
ALBUMIN: 4.2 g/dL (ref 3.6–5.1)
ALK PHOS: 72 U/L (ref 40–115)
ALT: 30 U/L (ref 9–46)
AST: 29 U/L (ref 10–35)
BUN: 18 mg/dL (ref 7–25)
CO2: 28 mmol/L (ref 20–31)
Calcium: 9.6 mg/dL (ref 8.6–10.3)
Chloride: 104 mmol/L (ref 98–110)
Creat: 0.98 mg/dL (ref 0.70–1.18)
Glucose, Bld: 123 mg/dL — ABNORMAL HIGH (ref 65–99)
POTASSIUM: 5 mmol/L (ref 3.5–5.3)
Sodium: 140 mmol/L (ref 135–146)
TOTAL PROTEIN: 6.5 g/dL (ref 6.1–8.1)
Total Bilirubin: 0.8 mg/dL (ref 0.2–1.2)

## 2015-06-20 NOTE — Progress Notes (Signed)
Cardiology Office Note   Date:  06/20/2015   ID:  Rodney Hester, DOB [birth year], MRN [id number]  PCP:  Lillia Mountain, MD  Cardiologist:   Kristeen Miss, MD   Chief Complaint  Patient presents with  . Follow-up    CAD   1. CAD - s/p inferior wall myocardial infarction in 1999-status post stent placement to the right coronary artery. He is also status post stent placement to the first diagonal artery in March, 2000. At that time he had angioplasty to the LAD because of "snow plowing of plaque down the LAD from the 1st diagonal.  2. Hyperlipidemia 3. Kidney stone- left kidney,   History of Present Illness:  75 year old gentleman with a history of coronary artery disease. Status post myocardial infarction in 1998. He has done very well. He has not had any episodes of angina. He works out on a daily basis.  He had a kidney stone this summer. He eventually required extraction of the stone by Dr. Charlyn Minerva. [name] feeling quite a bit better and is now back doing his normal workouts.  July 14, 2012:  Rodney Hester is doing well. No CP or dyspnea. He has had some plantar fascitis and has not been working out much. He also has had a kidney stone removed and had hand surgery.   He has returned to work. 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  Jan. 5, 2014:  He has retired (again). He started back working and was not able to work out and gained weight. No CP or dyspnea.   Sept. 29, 2015:  Rodney Hester has had a cold and cough for the past 3 months. Is taking Afrin. BP is a bit high.  Still battling plantar fascitis.   He has started working again. ( 3rd career) He has already retired twice.   Feb. 25, 2015  Rodney Hester is a 75 y.o. male who presents for follow-up of his coronary artery disease. Rodney Hester is doing well. Had nasal surgery Feb. 9.    Had some chest pain after doing some pushs recently.    He thinks it was a muscular  issue.   Has had plantas fascitis.  He thinks it was due to his old orthotics.  His foot pain is better.   Dec. 2, 2016:    Rodney Hester is doing ok from a cardiac standpoint Has had achilles tendonitis.   June, 1, 2017:  Doing well from a cardiac standpoint.   Still has achilles tendonitis . Very slow getting better .  BP looks good No CP or dyspnea   Past Medical History  Diagnosis Date  . Hyperlipidemia   . Hypertension   . History of acute inferior wall myocardial infarction 1999-  S/P STENT RCA  . S/P primary angioplasty with coronary stent   . Left ureteral calculus   . Coronary artery disease CARDIOLOGIST- DR Elease Hashimoto    MI 8/99  (09-08-2011  PT DENIES S & S)    Past Surgical History  Procedure Laterality Date  . Trigger finger release  06/02/2011-  LOCAL ONLY    Procedure: MINOR RELEASE TRIGGER FINGER/A-1 PULLEY;  Surgeon: Wyn Forster., MD;  Location: [facility];  Service: Orthopedics;  Laterality: Left;  left ring  . Pulley release right ring finger/ limited synovectomy  12-26-2009  . Extracorporeal shock wave lithotripsy  08-03-2011    LEFT  . Anterior cruciate ligament repair  1970'S    LEFT KNEE  . Cardiovascular stress  test  2011  DR Vanilla Heatherington    PER PT NORMAL  . Coronary angioplasty with stent placement  AUG 1999    STENTING RCA  . Coronary angioplasty  NOV 1999    FIRST DIAGONAL BRANCH  . Coronary angioplasty with stent placement  MAR 2000    STENTING FIRST DIAGONAL FOR RESTENOSIS  AND PCI OF LAD  . Cystoscopy/retrograde/ureteroscopy/stone extraction with basket  09/11/2011    Procedure: CYSTOSCOPY/RETROGRADE/URETEROSCOPY/STONE EXTRACTION WITH BASKET;  Surgeon: Garnett Farm, MD;  Location: [facility];  Service: Urology;  Laterality: Left;     Current Outpatient Prescriptions  Medication Sig Dispense Refill  . aspirin 81 MG tablet Take 81 mg by mouth daily.     Marland Kitchen atorvastatin (LIPITOR) 40 MG tablet TAKE ONE TABLET BY MOUTH  ONE TIME DAILY IN THE P.M. 30 tablet 11  . metoprolol (LOPRESSOR) 50 MG tablet TAKE ONE-HALF TABLET BY MOUTH TWICE DAILY 30 tablet 11  . Multiple Vitamin (MULTIVITAMIN) tablet Take 1 tablet by mouth daily.    . nitroGLYCERIN (NITROSTAT) 0.4 MG SL tablet Place 0.4 mg under the tongue every 5 (five) minutes as needed.     Marland Kitchen telmisartan (MICARDIS) 80 MG tablet TAKE ONE TABLET BY MOUTH ONE TIME DAILY 30 tablet 11   No current facility-administered medications for this visit.    Allergies:   Lisinopril and Zocor    Social History:  The patient  reports that he has quit smoking. His smoking use included Cigarettes. He does not have any smokeless tobacco history on file. He reports that he drinks alcohol. He reports that he does not use illicit drugs.   Family History:  The patient's family history includes Heart attack in his father.    ROS:  Please see the history of present illness.    Review of Systems: Constitutional:  denies fever, chills, diaphoresis, appetite change and fatigue.  HEENT: denies photophobia, eye pain, redness, hearing loss, ear pain, congestion, sore throat, rhinorrhea, sneezing, neck pain, neck stiffness and tinnitus.  Respiratory: denies SOB, DOE, cough, chest tightness, and wheezing.  Cardiovascular: denies chest pain, palpitations and leg swelling.  Gastrointestinal: denies nausea, vomiting, abdominal pain, diarrhea, constipation, blood in stool.  Genitourinary: denies dysuria, urgency, frequency, hematuria, flank pain and difficulty urinating.  Musculoskeletal: denies  myalgias, back pain, joint swelling, arthralgias and gait problem.   Skin: denies pallor, rash and wound.  Neurological: denies dizziness, seizures, syncope, weakness, light-headedness, numbness and headaches.   Hematological: denies adenopathy, easy bruising, personal or family bleeding history.  Psychiatric/ Behavioral: denies suicidal ideation, mood changes, confusion, nervousness, sleep  disturbance and agitation.       All other systems are reviewed and negative.    PHYSICAL EXAM: VS:  BP 136/74 mmHg  Pulse 70  Ht  (1.6 m)  Wt 155 lb 12.8 oz (70.67 kg)  BMI 27.61 kg/m2 , BMI Body mass index is 27.61 kg/(m^2). GEN: Well nourished, well developed, in no acute distress HEENT: normal Neck: no JVD, carotid bruits, or masses Cardiac: RRR; no murmurs, rubs, or gallops,no edema  Respiratory:  clear to auscultation bilaterally, normal work of breathing GI: soft, nontender, nondistended, + BS MS: no deformity or atrophy Skin: warm and dry, no rash Neuro:  Strength and sensation are intact Psych: normal   EKG:  EKG is ordered today. The ekg ordered today demonstrates NSR at 71.  Normal ECG    Recent Labs: 12/21/2014: ALT 52*; BUN 16; Creat 0.87; Potassium 5.0; Sodium 142  Lipid Panel    Component Value Date/Time   CHOL 130 12/21/2014 0945   TRIG 61 12/21/2014 0945   HDL 51 12/21/2014 0945   CHOLHDL 2.5 12/21/2014 0945   VLDL 12 12/21/2014 0945   LDLCALC 67 12/21/2014 0945      Wt Readings from Last 3 Encounters:  06/20/15 155 lb 12.8 oz (70.67 kg)  12/21/14 159 lb (72.122 kg)  03/15/14 160 lb (72.576 kg)      Other studies Reviewed: Additional studies/ records that were reviewed today include: . Review of the above records demonstrates:    ASSESSMENT AND PLAN:  1. CAD - s/p inferior wall myocardial infarction in 1999-status post stent placement to the right coronary artery. He is also status post stent placement to the first diagonal artery in March, 2000. At that time he had angioplasty to the LAD because of "snow plowing of plaque down the LAD from the 1st diagonal.  Doing well.   2. Hyperlipidemia - Continue same medications. Check lipids , liver enz, and BMP today   3. Kidney stone- left kidney,   4. Achilles tendinitis:  This seems to be his main limitation at this point. [facility]  Current medicines are reviewed at length with the patient today.  The patient does not have concerns regarding medicines.  The following changes have been made:  no change   Disposition:   FU with me in 6 months     Signed, Kristeen MissPhilip Maryl Blalock, MD  06/20/2015 10:03 AM    [facility] [address], [place], [place]  [address] Phone: [phone](336) [phone]; Fax: [phone]

## 2015-06-20 NOTE — Patient Instructions (Signed)
Medication Instructions:  Your physician recommends that you continue on your current medications as directed. Please refer to the Current Medication list given to you today.   Labwork: TODAY - complete metabolic panel, cholesterol   Testing/Procedures: None Ordered   Follow-Up: Your physician wants you to follow-up in: 6 months with Dr. Nahser.  You will receive a reminder letter in the mail two months in advance. If you don't receive a letter, please call our office to schedule the follow-up appointment.   If you need a refill on your cardiac medications before your next appointment, please call your pharmacy.   Thank you for choosing [facility]! Amila Callies, RN [phone]    

## 2015-12-10 ENCOUNTER — Encounter: Payer: Self-pay | Admitting: Cardiovascular Disease

## 2015-12-11 ENCOUNTER — Encounter: Payer: Self-pay | Admitting: Cardiovascular Disease

## 2015-12-20 ENCOUNTER — Other Ambulatory Visit: Payer: Self-pay | Admitting: Cardiovascular Disease

## 2015-12-23 ENCOUNTER — Ambulatory Visit: Payer: BC Managed Care – PPO | Admitting: Cardiovascular Disease

## 2015-12-23 ENCOUNTER — Ambulatory Visit (INDEPENDENT_AMBULATORY_CARE_PROVIDER_SITE_OTHER): Payer: BC Managed Care – PPO | Admitting: Cardiovascular Disease

## 2015-12-23 ENCOUNTER — Encounter: Payer: Self-pay | Admitting: Cardiovascular Disease

## 2015-12-23 VITALS — BP 130/70 | HR 72 | Ht 63.0 in | Wt 159.0 lb

## 2015-12-23 DIAGNOSIS — I158 Other secondary hypertension: Secondary | ICD-10-CM

## 2015-12-23 DIAGNOSIS — I2510 Atherosclerotic heart disease of native coronary artery without angina pectoris: Secondary | ICD-10-CM | POA: Diagnosis not present

## 2015-12-23 LAB — LIPID PANEL
CHOL/HDL RATIO: 2.4 ratio (ref <5.0)
Cholesterol: 133 mg/dL (ref <200)
HDL: 55 mg/dL (ref >40)
LDL CALC: 64 mg/dL (ref <100)
Triglycerides: 69 mg/dL (ref <150)
VLDL: 14 mg/dL (ref <30)

## 2015-12-23 LAB — COMPREHENSIVE METABOLIC PANEL
ALT: 34 U/L (ref 9–46)
AST: 29 U/L (ref 10–35)
Albumin: 4.1 g/dL (ref 3.6–5.1)
Alkaline Phosphatase: 65 U/L (ref 40–115)
BILIRUBIN TOTAL: 0.7 mg/dL (ref 0.2–1.2)
BUN: 19 mg/dL (ref 7–25)
CO2: 29 mmol/L (ref 20–31)
CREATININE: 0.83 mg/dL (ref 0.70–1.18)
Calcium: 9.3 mg/dL (ref 8.6–10.3)
Chloride: 107 mmol/L (ref 98–110)
GLUCOSE: 133 mg/dL — AB (ref 65–99)
Potassium: 5.1 mmol/L (ref 3.5–5.3)
SODIUM: 141 mmol/L (ref 135–146)
Total Protein: 6.5 g/dL (ref 6.1–8.1)

## 2015-12-23 NOTE — Patient Instructions (Signed)
Medication Instructions:  Your physician recommends that you continue on your current medications as directed. Please refer to the Current Medication list given to you today.   Labwork: TODAY - complete metabolic panel, cholesterol   Testing/Procedures: None Ordered   Follow-Up: Your physician wants you to follow-up in: 6 months with Dr. Nahser.  You will receive a reminder letter in the mail two months in advance. If you don't receive a letter, please call our office to schedule the follow-up appointment.   If you need a refill on your cardiac medications before your next appointment, please call your pharmacy.   Thank you for choosing [facility]! Marsia Cino, RN [phone]    

## 2015-12-23 NOTE — Addendum Note (Signed)
Addended by: Levi AlandSWINYER, Khamia Stambaugh M on: 12/23/2015 04:14 PM   Modules accepted: Orders

## 2015-12-23 NOTE — Progress Notes (Signed)
Cardiology Office Note   Date:  12/23/2015   ID:  Rodney Hester, DOB [birth year], MRN [id number]  PCP:  Lillia Mountain, MD  Cardiologist:   Kristeen Miss, MD   Chief Complaint  Patient presents with  . Coronary Artery Disease   1. CAD - s/p inferior wall myocardial infarction in 1999-status post stent placement to the right coronary artery. He is also status post stent placement to the first diagonal artery in March, 2000. At that time he had angioplasty to the LAD because of "snow plowing of plaque down the LAD from the 1st diagonal.  2. Hyperlipidemia 3. Kidney stone- left kidney,      75 y.o. year-old gentleman with a history of coronary artery disease. Status post myocardial infarction in 1998. He has done very well. He has not had any episodes of angina. He works out on a daily basis.  He had a kidney stone this summer. He eventually required extraction of the stone by Dr. Charlyn Minerva. [name] feeling quite a bit better and is now back doing his normal workouts.  July 14, 2012:  Rodney Hester is doing well. No CP or dyspnea. He has had some plantar fascitis and has not been working out much. He also has had a kidney stone removed and had hand surgery.   He has returned to work. 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  Jan. 5, 2014:  He has retired (again). He started back working and was not able to work out and gained weight. No CP or dyspnea.   Sept. 29, 2015:  Rodney Hester has had a cold and cough for the past 3 months. Is taking Afrin. BP is a bit high.  Still battling plantar fascitis.   He has started working again. ( 3rd career) He has already retired twice.   Feb. 25, 2015  Rodney Hester is a 74 y.o. male who presents for follow-up of his coronary artery disease. Rodney Hester is doing well. Had nasal surgery Feb. 9.    Had some chest pain after doing some pushs recently.    He thinks it was a muscular issue.   Has had  plantas fascitis.  He thinks it was due to his old orthotics.  His foot pain is better.   Dec. 2, 2016:    Rodney Hester is doing ok from a cardiac standpoint Has had achilles tendonitis.   June, 1, 2017:  Doing well from a cardiac standpoint.   Still has achilles tendonitis . Very slow getting better .  BP looks good No CP or dyspnea   Dec. 4, 2017:    Rodney Hester is seen today for follow up visit  Is doing much better.   Has been treating achilies tendonitits for the past 15 months .   Doing well.    Past Medical History:  Diagnosis Date  . Coronary artery disease CARDIOLOGIST- DR Elease Hashimoto   MI 8/99  (09-08-2011  PT DENIES S & S)  . History of acute inferior wall myocardial infarction 1999-  S/P STENT RCA  . Hyperlipidemia   . Hypertension   . Left ureteral calculus   . S/P primary angioplasty with coronary stent     Past Surgical History:  Procedure Laterality Date  . ANTERIOR CRUCIATE LIGAMENT REPAIR  1970'S   LEFT KNEE  . CARDIOVASCULAR STRESS TEST  2011  DR Corvin Sorbo   PER PT NORMAL  . CORONARY ANGIOPLASTY  NOV 1999   FIRST DIAGONAL BRANCH  . CORONARY ANGIOPLASTY WITH STENT  PLACEMENT  AUG 1999   STENTING RCA  . CORONARY ANGIOPLASTY WITH STENT PLACEMENT  MAR 2000   STENTING FIRST DIAGONAL FOR RESTENOSIS  AND PCI OF LAD  . CYSTOSCOPY/RETROGRADE/URETEROSCOPY/STONE EXTRACTION WITH BASKET  09/11/2011   Procedure: CYSTOSCOPY/RETROGRADE/URETEROSCOPY/STONE EXTRACTION WITH BASKET;  Surgeon: Garnett FarmMark C Ottelin, MD;  [facility];  Service: Urology;  Laterality: Left;  . EXTRACORPOREAL SHOCK WAVE LITHOTRIPSY  08-03-2011   LEFT  . PULLEY RELEASE RIGHT RING FINGER/ LIMITED SYNOVECTOMY  12-26-2009  . TRIGGER FINGER RELEASE  06/02/2011-  LOCAL ONLY   Procedure: MINOR RELEASE TRIGGER FINGER/A-1 PULLEY;  Surgeon: Wyn Forsterobert V Sypher Jr., MD;  Location: [facility];  Service: Orthopedics;  Laterality: Left;  left ring     Current Outpatient Prescriptions    Medication Sig Dispense Refill  . aspirin 81 MG tablet Take 81 mg by mouth daily.     Marland Kitchen. atorvastatin (LIPITOR) 40 MG tablet TAKE ONE TABLET BY MOUTH ONE TIME DAILY IN THE P.M. 30 tablet 6  . metoprolol (LOPRESSOR) 50 MG tablet TAKE ONE-HALF TABLET BY MOUTH TWICE DAILY 30 tablet 6  . Multiple Vitamin (MULTIVITAMIN) tablet Take 1 tablet by mouth daily.    . nitroGLYCERIN (NITROSTAT) 0.4 MG SL tablet Place 0.4 mg under the tongue every 5 (five) minutes as needed.     Marland Kitchen. telmisartan (MICARDIS) 80 MG tablet TAKE ONE TABLET BY MOUTH ONE TIME DAILY 30 tablet 6   No current facility-administered medications for this visit.     Allergies:   Lisinopril and Zocor [simvastatin]    Social History:  The patient  reports that he has quit smoking. His smoking use included Cigarettes. He has never used smokeless tobacco. He reports that he drinks alcohol. He reports that he does not use drugs.   Family History:  The patient's family history includes Heart attack in his father.    ROS:  Please see the history of present illness.    Review of Systems: Constitutional:  denies fever, chills, diaphoresis, appetite change and fatigue.  HEENT: denies photophobia, eye pain, redness, hearing loss, ear pain, congestion, sore throat, rhinorrhea, sneezing, neck pain, neck stiffness and tinnitus.  Respiratory: denies SOB, DOE, cough, chest tightness, and wheezing.  Cardiovascular: denies chest pain, palpitations and leg swelling.  Gastrointestinal: denies nausea, vomiting, abdominal pain, diarrhea, constipation, blood in stool.  Genitourinary: denies dysuria, urgency, frequency, hematuria, flank pain and difficulty urinating.  Musculoskeletal: denies  myalgias, back pain, joint swelling, arthralgias and gait problem.   Skin: denies pallor, rash and wound.  Neurological: denies dizziness, seizures, syncope, weakness, light-headedness, numbness and headaches.   Hematological: denies adenopathy, easy bruising,  personal or family bleeding history.  Psychiatric/ Behavioral: denies suicidal ideation, mood changes, confusion, nervousness, sleep disturbance and agitation.       All other systems are reviewed and negative.    PHYSICAL EXAM: VS:  BP 130/70   Pulse 72   Ht 5\' 3"  (1.6 m)   Wt 159 lb (72.1 kg)   BMI 28.17 kg/m  , BMI Body mass index is 28.17 kg/m. GEN: Well nourished, well developed, in no acute distress  HEENT: normal  Neck: no JVD, carotid bruits, or masses Cardiac: RRR; no murmurs, rubs, or gallops,no edema  Respiratory:  clear to auscultation bilaterally, normal work of breathing GI: soft, nontender, nondistended, + BS MS: no deformity or atrophy  Skin: warm and dry, no rash Neuro:  Strength and sensation are intact Psych: normal   EKG:  EKG is  ordered today. The ekg ordered today demonstrates NSR at 72.  Normal   Recent Labs: 06/20/2015: ALT 30; BUN 18; Creat 0.98; Potassium 5.0; Sodium 140    Lipid Panel    Component Value Date/Time   CHOL 155 06/20/2015 1008   TRIG 87 06/20/2015 1008   HDL 59 06/20/2015 1008   CHOLHDL 2.6 06/20/2015 1008   VLDL 17 06/20/2015 1008   LDLCALC 79 06/20/2015 1008      Wt Readings from Last 3 Encounters:  12/23/15 159 lb (72.1 kg)  06/20/15 155 lb 12.8 oz (70.7 kg)  12/21/14 159 lb (72.1 kg)      Other studies Reviewed: Additional studies/ records that were reviewed today include: . Review of the above records demonstrates:    ASSESSMENT AND PLAN:  1. CAD - s/p inferior wall myocardial infarction in 1999-status post stent placement to the right coronary artery. He is also status post stent placement to the first diagonal artery in March, 2000. At that time he had angioplasty to the LAD because of "snow plowing of plaque down the LAD from the 1st diagonal.  Doing well.   2. Hyperlipidemia - Continue same medications. Check lipids , liver enz, and BMP today   3. Kidney stone- left kidney,   4. Achilles tendinitis:   This seems to be his main limitation at this point. [facility]  Current medicines are reviewed at length with the patient today.  The patient does not have concerns regarding medicines.  The following changes have been made:  no change   Disposition:   FU with me in 6 months     Signed, Kristeen MissPhilip Natale Thoma, MD  12/23/2015 10:03 AM    [facility] [address], [place], [place]  [address] Phone: [phone]; Fax: [phone]

## 2016-04-15 ENCOUNTER — Other Ambulatory Visit: Payer: Self-pay | Admitting: Orthopedic Surgery

## 2016-04-15 DIAGNOSIS — M75102 Unspecified rotator cuff tear or rupture of left shoulder, not specified as traumatic: Secondary | ICD-10-CM

## 2016-04-26 ENCOUNTER — Ambulatory Visit
Admission: RE | Admit: 2016-04-26 | Discharge: 2016-04-26 | Disposition: A | Discharge status: Home / Self Care | Payer: BC Managed Care – PPO | Source: Ambulatory Visit | Attending: Orthopedic Surgery | Admitting: Orthopedic Surgery

## 2016-04-26 DIAGNOSIS — M75102 Unspecified rotator cuff tear or rupture of left shoulder, not specified as traumatic: Secondary | ICD-10-CM

## 2016-05-20 ENCOUNTER — Telehealth: Payer: Self-pay | Admitting: Cardiovascular Disease

## 2016-05-20 NOTE — Telephone Encounter (Signed)
Pt may hold his ASA for 5 days prior to his ortho procedure .  Restart ASAP following the procedure

## 2016-05-20 NOTE — Telephone Encounter (Signed)
Patient takes aspirin 81 mg not 325 mg

## 2016-05-20 NOTE — Telephone Encounter (Signed)
New message  Albin Felling from [facility] is calling for clearance. She is also faxing over clearance form.   Request for surgical clearance:  1. What type of surgery is being performed? Left shoulder scope   2. When is this surgery scheduled? Tuesday may 8th  3. Are there any medications that need to be held prior to surgery and how long? Aspirin 325 mg-they need to know when pt should stop and for how long?  4. Name of physician performing surgery? Dr. Ave Filter  5. What is your office phone and fax number? Phone-[phone] [phone]

## 2016-05-21 NOTE — Telephone Encounter (Signed)
Clearance printed and placed in HIM to be [facility]

## 2016-07-31 ENCOUNTER — Other Ambulatory Visit: Payer: Self-pay | Admitting: Cardiovascular Disease

## 2016-09-14 ENCOUNTER — Encounter: Payer: Self-pay | Admitting: Cardiovascular Disease

## 2016-09-14 ENCOUNTER — Ambulatory Visit (INDEPENDENT_AMBULATORY_CARE_PROVIDER_SITE_OTHER): Payer: BC Managed Care – PPO | Admitting: Cardiovascular Disease

## 2016-09-14 VITALS — BP 150/76 | HR 60 | Ht 63.0 in | Wt 156.8 lb

## 2016-09-14 DIAGNOSIS — I10 Essential (primary) hypertension: Secondary | ICD-10-CM | POA: Diagnosis not present

## 2016-09-14 DIAGNOSIS — R0989 Other specified symptoms and signs involving the circulatory and respiratory systems: Secondary | ICD-10-CM | POA: Diagnosis not present

## 2016-09-14 DIAGNOSIS — I2510 Atherosclerotic heart disease of native coronary artery without angina pectoris: Secondary | ICD-10-CM

## 2016-09-14 LAB — BASIC METABOLIC PANEL
BUN/Creatinine Ratio: 20 (ref 10–24)
BUN: 18 mg/dL (ref 8–27)
CALCIUM: 9.4 mg/dL (ref 8.6–10.2)
CHLORIDE: 102 mmol/L (ref 96–106)
CO2: 25 mmol/L (ref 20–29)
Creatinine, Ser: 0.9 mg/dL (ref 0.76–1.27)
GFR, EST AFRICAN AMERICAN: 98 mL/min/{1.73_m2} (ref >59)
GFR, EST NON AFRICAN AMERICAN: 84 mL/min/{1.73_m2} (ref >59)
Glucose: 130 mg/dL — ABNORMAL HIGH (ref 65–99)
POTASSIUM: 5 mmol/L (ref 3.5–5.2)
Sodium: 141 mmol/L (ref 134–144)

## 2016-09-14 LAB — LIPID PANEL
CHOL/HDL RATIO: 2.5 ratio (ref 0.0–5.0)
Cholesterol, Total: 139 mg/dL (ref 100–199)
HDL: 56 mg/dL (ref >39)
LDL CALC: 66 mg/dL (ref 0–99)
Triglycerides: 85 mg/dL (ref 0–149)
VLDL CHOLESTEROL CAL: 17 mg/dL (ref 5–40)

## 2016-09-14 LAB — HEPATIC FUNCTION PANEL
ALBUMIN: 4.3 g/dL (ref 3.5–4.8)
ALT: 37 IU/L (ref 0–44)
AST: 30 IU/L (ref 0–40)
Alkaline Phosphatase: 68 IU/L (ref 39–117)
BILIRUBIN TOTAL: 0.7 mg/dL (ref 0.0–1.2)
Bilirubin, Direct: 0.18 mg/dL (ref 0.00–0.40)
Total Protein: 6.4 g/dL (ref 6.0–8.5)

## 2016-09-14 NOTE — Progress Notes (Signed)
Cardiology Office Note   Date:  09/14/2016   ID:  Rodney Hester, DOB Jul 19, 1940, MRN [id number]  PCP:  Kirby Funk, MD  Cardiologist:   Kristeen Miss, MD   Chief Complaint  Patient presents with  . Coronary Artery Disease   1. CAD - s/p inferior wall myocardial infarction in 1999-status post stent placement to the right coronary artery. He is also status post stent placement to the first diagonal artery in March, 2000. At that time he had angioplasty to the LAD because of "snow plowing of plaque down the LAD from the 1st diagonal.  2. Hyperlipidemia 3. Kidney stone- left kidney,      76 y.o. year-old gentleman with a history of coronary artery disease. Status post myocardial infarction in 1998. He has done very well. He has not had any episodes of angina. He works out on a daily basis.  He had a kidney stone this summer. He eventually required extraction of the stone by Dr. Charlyn Minerva. [name] feeling quite a bit better and is now back doing his normal workouts.  July 14, 2012:  Rodney Hester is doing well. No CP or dyspnea. He has had some plantar fascitis and has not been working out much. He also has had a kidney stone removed and had hand surgery.   He has returned to work. 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  Jan. 5, 2014:  He has retired (again). He started back working and was not able to work out and gained weight. No CP or dyspnea.   Sept. 29, 2015:  Rodney Hester has had a cold and cough for the past 3 months. Is taking Afrin. BP is a bit high.  Still battling plantar fascitis.   He has started working again. ( 3rd career) He has already retired twice.   Feb. 25, 2015  Rodney Hester is a 76 y.o. male who presents for follow-up of his coronary artery disease. Raiquan is doing well. Had nasal surgery Feb. 9.    Had some chest pain after doing some pushs recently.    He thinks it was a muscular issue.   Has had  plantas fascitis.  He thinks it was due to his old orthotics.  His foot pain is better.   Dec. 2, 2016:    Rodney Hester is doing ok from a cardiac standpoint Has had achilles tendonitis.   June, 1, 2017:  Doing well from a cardiac standpoint.   Still has achilles tendonitis . Very slow getting better .  BP looks good No CP or dyspnea   Dec. 4, 2017:    Spisak is seen today for follow up visit  Is doing much better.   Has been treating achilies tendonitits for the past 15 months .   Doing well.    Aug. 27, 2018:  Rodney Hester is seen today  Still getting over left shoulder surgery.   Is back exercising  Walks 4 miles a day , 6 days a week.     Past Medical History:  Diagnosis Date  . Coronary artery disease CARDIOLOGIST- DR Elease Hashimoto   MI 8/99  (09-08-2011  PT DENIES S & S)  . History of acute inferior wall myocardial infarction 1999-  S/P STENT RCA  . Hyperlipidemia   . Hypertension   . Left ureteral calculus   . S/P primary angioplasty with coronary stent     Past Surgical History:  Procedure Laterality Date  . ANTERIOR CRUCIATE LIGAMENT REPAIR  1970'S  LEFT KNEE  . CARDIOVASCULAR STRESS TEST  2011  DR Dea Bitting   PER PT NORMAL  . CORONARY ANGIOPLASTY  NOV 1999   FIRST DIAGONAL BRANCH  . CORONARY ANGIOPLASTY WITH STENT PLACEMENT  AUG 1999   STENTING RCA  . CORONARY ANGIOPLASTY WITH STENT PLACEMENT  MAR 2000   STENTING FIRST DIAGONAL FOR RESTENOSIS  AND PCI OF LAD  . CYSTOSCOPY/RETROGRADE/URETEROSCOPY/STONE EXTRACTION WITH BASKET  09/11/2011   Procedure: CYSTOSCOPY/RETROGRADE/URETEROSCOPY/STONE EXTRACTION WITH BASKET;  Surgeon: Garnett Farm, MD;  Location: [facility];  Service: Urology;  Laterality: Left;  . EXTRACORPOREAL SHOCK WAVE LITHOTRIPSY  08-03-2011   LEFT  . PULLEY RELEASE RIGHT RING FINGER/ LIMITED SYNOVECTOMY  12-26-2009  . TRIGGER FINGER RELEASE  06/02/2011-  LOCAL ONLY   Procedure: MINOR RELEASE TRIGGER FINGER/A-1 PULLEY;  Surgeon: Wyn Forster., MD;  Location: [facility];  Service: Orthopedics;  Laterality: Left;  left ring     Current Outpatient Prescriptions  Medication Sig Dispense Refill  . aspirin 81 MG tablet Take 81 mg by mouth daily.     Marland Kitchen atorvastatin (LIPITOR) 40 MG tablet TAKE ONE TABLET BY MOUTH ONE TIME DAILY IN THE P.M. 30 tablet 6  . metoprolol tartrate (LOPRESSOR) 50 MG tablet TAKE ONE-HALF TABLET BY MOUTH TWICE DAILY 30 tablet 6  . Multiple Vitamin (MULTIVITAMIN) tablet Take 1 tablet by mouth daily.    . nitroGLYCERIN (NITROSTAT) 0.4 MG SL tablet Place 0.4 mg under the tongue every 5 (five) minutes as needed.     Marland Kitchen telmisartan (MICARDIS) 80 MG tablet TAKE ONE TABLET BY MOUTH ONE TIME DAILY 30 tablet 6   No current facility-administered medications for this visit.     Allergies:   Lisinopril and Zocor [simvastatin]    Social History:  The patient  reports that he has quit smoking. His smoking use included Cigarettes. He has never used smokeless tobacco. He reports that he drinks alcohol. He reports that he does not use drugs.   Family History:  The patient's family history includes Heart attack in his father.    ROS:  Please see the history of present illness.    Review of Systems: Constitutional:  denies fever, chills, diaphoresis, appetite change and fatigue.  HEENT: denies photophobia, eye pain, redness, hearing loss, ear pain, congestion, sore throat, rhinorrhea, sneezing, neck pain, neck stiffness and tinnitus.  Respiratory: denies SOB, DOE, cough, chest tightness, and wheezing.  Cardiovascular: denies chest pain, palpitations and leg swelling.  Gastrointestinal: denies nausea, vomiting, abdominal pain, diarrhea, constipation, blood in stool.  Genitourinary: denies dysuria, urgency, frequency, hematuria, flank pain and difficulty urinating.  Musculoskeletal: denies  myalgias, back pain, joint swelling, arthralgias and gait problem.   Skin: denies pallor, rash and wound.    Neurological: denies dizziness, seizures, syncope, weakness, light-headedness, numbness and headaches.   Hematological: denies adenopathy, easy bruising, personal or family bleeding history.  Psychiatric/ Behavioral: denies suicidal ideation, mood changes, confusion, nervousness, sleep disturbance and agitation.       All other systems are reviewed and negative.    PHYSICAL EXAM: VS:  BP (!) 150/76   Pulse 60   Ht 5\' 3"  (1.6 m)   Wt 156 lb 12.8 oz (71.1 kg)   SpO2 96%   BMI 27.78 kg/m  , BMI Body mass index is 27.78 kg/m. GEN: Well nourished, well developed, in no acute distress  HEENT: normal  Neck: no JVD,  or masses.  Soft right carotid bruit  Cardiac: RRR; no murmurs,  rubs, or gallops,no edema  Respiratory:  clear to auscultation bilaterally, normal work of breathing GI: soft, nontender, nondistended, + BS MS: no deformity or atrophy  Skin: warm and dry, no rash Neuro:  Strength and sensation are intact Psych: normal   EKG:  EKG is ordered today.  Aug. 27, 2018:    NSR at 60.  Normal ECG  Recent Labs: 12/23/2015: ALT 34; BUN 19; Creat 0.83; Potassium 5.1; Sodium 141    Lipid Panel    Component Value Date/Time   CHOL 133 12/23/2015 1125   TRIG 69 12/23/2015 1125   HDL 55 12/23/2015 1125   CHOLHDL 2.4 12/23/2015 1125   VLDL 14 12/23/2015 1125   LDLCALC 64 12/23/2015 1125      Wt Readings from Last 3 Encounters:  09/14/16 156 lb 12.8 oz (71.1 kg)  12/23/15 159 lb (72.1 kg)  06/20/15 155 lb 12.8 oz (70.7 kg)      Other studies Reviewed: Additional studies/ records that were reviewed today include: . Review of the above records demonstrates:    ASSESSMENT AND PLAN:  1. CAD - s/p inferior wall myocardial infarction in 1999-status post stent placement to the right coronary artery. He is also status post stent placement to the first diagonal artery in March, 2000. At that time he had angioplasty to the LAD because of "snow plowing of plaque down the LAD  from the 1st diagonal.  Doing well.   No angina .  Will reduce ASA dose to 81 mg a day   2. Hyperlipidemia - Continue same medications. Check lipids , liver enz, and BMP today   3.  Carotid artery disease. - has a soft right carotid bruit .  Will get a carotid duplex.   4. Achilles tendinitis:  This seems to be his main limitation at this point. [facility]  Current medicines are reviewed at length with the patient today.  The patient does not have concerns regarding medicines.  The following changes have been made:  no change   Disposition:   FU with me in 6 months     Signed, Kristeen Miss, MD  09/14/2016 8:54 AM    [facility] [address] [place], [place], [place]  [address] Phone: [phone]; Fax: [phone]

## 2016-09-14 NOTE — Patient Instructions (Signed)
Medication Instructions:  DECREASE Aspirin to 81 mg once daily    Labwork: TODAY - cholesterol, liver panel, basic metabolic panel   Testing/Procedures: Your physician has requested that you have a carotid duplex. This test is an ultrasound of the carotid arteries in your neck. It looks at blood flow through these arteries that supply the brain with blood. Allow one hour for this exam. There are no restrictions or special instructions.   Follow-Up: Your physician wants you to follow-up in: 6 months with Dr. Elease Hashimoto.  You will receive a reminder letter in the mail two months in advance. If you don't receive a letter, please call our office to schedule the follow-up appointment.   If you need a refill on your cardiac medications before your next appointment, please call your pharmacy.   Thank you for choosing [facility]! Eligha Bridegroom, RN [phone]

## 2016-09-16 ENCOUNTER — Ambulatory Visit (HOSPITAL_COMMUNITY)
Admission: RE | Admit: 2016-09-16 | Discharge: 2016-09-16 | Disposition: A | Discharge status: Home / Self Care | Payer: BC Managed Care – PPO | Source: Ambulatory Visit | Attending: Cardiology | Admitting: Cardiology

## 2016-09-16 ENCOUNTER — Telehealth: Payer: Self-pay | Admitting: Cardiovascular Disease

## 2016-09-16 DIAGNOSIS — I6523 Occlusion and stenosis of bilateral carotid arteries: Secondary | ICD-10-CM | POA: Insufficient documentation

## 2016-09-16 DIAGNOSIS — R0989 Other specified symptoms and signs involving the circulatory and respiratory systems: Secondary | ICD-10-CM | POA: Diagnosis not present

## 2016-09-16 NOTE — Telephone Encounter (Signed)
New message    Pt is calling asking for a call back about his carotid results. Please call.

## 2016-09-16 NOTE — Telephone Encounter (Signed)
Pt aware of carotid results ./cy 

## 2017-02-19 ENCOUNTER — Other Ambulatory Visit: Payer: Self-pay | Admitting: *Deleted

## 2017-02-19 MED ORDER — METOPROLOL TARTRATE 50 MG PO TABS: 25.0000 mg | ORAL_TABLET | Freq: Two times a day (BID) | ORAL | 1 refills | Status: DC

## 2017-02-19 MED ORDER — ATORVASTATIN CALCIUM 40 MG PO TABS: ORAL_TABLET | ORAL | 1 refills | Status: DC

## 2017-02-19 MED ORDER — TELMISARTAN 80 MG PO TABS: 80.0000 mg | ORAL_TABLET | Freq: Every day | ORAL | 1 refills | Status: DC

## 2017-03-16 ENCOUNTER — Encounter: Payer: Self-pay | Admitting: Cardiovascular Disease

## 2017-03-16 ENCOUNTER — Ambulatory Visit: Payer: BC Managed Care – PPO | Admitting: Cardiovascular Disease

## 2017-03-16 VITALS — BP 124/64 | HR 77 | Ht 63.0 in | Wt 155.8 lb

## 2017-03-16 DIAGNOSIS — E782 Mixed hyperlipidemia: Secondary | ICD-10-CM | POA: Diagnosis not present

## 2017-03-16 DIAGNOSIS — R0989 Other specified symptoms and signs involving the circulatory and respiratory systems: Secondary | ICD-10-CM

## 2017-03-16 DIAGNOSIS — I2510 Atherosclerotic heart disease of native coronary artery without angina pectoris: Secondary | ICD-10-CM

## 2017-03-16 NOTE — Progress Notes (Signed)
Cardiology Office Note   Date:  03/16/2017   ID:  Rodney Hester, DOB 1940-09-17, MRN [id number]  PCP:  Rodney Hester, [name], MD  Cardiologist:   Kristeen MissPhilip Devlyn Retter, MD   Chief Complaint  Patient presents with  . Coronary Artery Disease   1. CAD - s/p inferior wall myocardial infarction in 1999-status post stent placement to the right coronary artery. He is also status post stent placement to the first diagonal artery in March, 2000. At that time he had angioplasty to the LAD because of "snow plowing of plaque down the LAD from the 1st diagonal.  2. Hyperlipidemia 3. Kidney stone- left kidney,       77 y.o. year-old gentleman with a history of coronary artery disease. Status post myocardial infarction in 1998. He has done very well. He has not had any episodes of angina. He works out on a daily basis.  He had a kidney stone this summer. He eventually required extraction of the stone by Dr. Charlyn MinervaMark Hester. [name] feeling quite a bit better and is now back doing his normal workouts.  July 14, 2012:  Rodney Hester is doing well. No CP or dyspnea. He has had some plantar fascitis and has not been working out much. He also has had a kidney stone removed and had hand surgery.   He has returned to work. 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  Jan. 5, 2014:  He has retired (again). He started back working and was not able to work out and gained weight. No CP or dyspnea.   Sept. 29, 2015:  Rodney Hester has had a cold and cough for the past 3 months. Is taking Afrin. BP is a bit high.  Still battling plantar fascitis.   He has started working again. ( 3rd career) He has already retired twice.   Feb. 25, 2015  Rodney Hester is a 77 y.o. male who presents for follow-up of his coronary artery disease. Rodney Hester is doing well. Had nasal surgery Feb. 9.    Had some chest pain after doing some pushs recently.    He thinks it was a muscular issue.   Has had  plantas fascitis.  He thinks it was due to his old orthotics.  His foot pain is better.   Dec. 2, 2016:    Rodney Hester is doing ok from a cardiac standpoint Has had achilles tendonitis.   June, 1, 2017:  Doing well from a cardiac standpoint.   Still has achilles tendonitis . Very slow getting better .  BP looks good No CP or dyspnea   Dec. 4, 2017:    Rodney Hester is seen today for follow up visit  Is doing much better.   Has been treating achilies tendonitits for the past 15 months .   Doing well.    Aug. 27, 2018:  Rodney Hester is seen today  Still getting over left shoulder surgery.   Is back exercising  Walks 4 miles a day , 6 days a week.   March 16, 2017: Rodney Hester is doing well.  Still active. Having right knee issues , twisted his knee  No angina   Past Medical History:  Diagnosis Date  . Coronary artery disease CARDIOLOGIST- DR Elease HashimotoNAHSER   MI 8/99  (09-08-2011  PT DENIES S & S)  . History of acute inferior wall myocardial infarction 1999-  S/P STENT RCA  . Hyperlipidemia   . Hypertension   . Left ureteral calculus   . S/P primary angioplasty with coronary  stent     Past Surgical History:  Procedure Laterality Date  . ANTERIOR CRUCIATE LIGAMENT REPAIR  1970'S   LEFT KNEE  . CARDIOVASCULAR STRESS TEST  2011  DR Jazzlynn Rawe   PER PT NORMAL  . CORONARY ANGIOPLASTY  NOV 1999   FIRST DIAGONAL BRANCH  . CORONARY ANGIOPLASTY WITH STENT PLACEMENT  AUG 1999   STENTING RCA  . CORONARY ANGIOPLASTY WITH STENT PLACEMENT  MAR 2000   STENTING FIRST DIAGONAL FOR RESTENOSIS  AND PCI OF LAD  . CYSTOSCOPY/RETROGRADE/URETEROSCOPY/STONE EXTRACTION WITH BASKET  09/11/2011   Procedure: CYSTOSCOPY/RETROGRADE/URETEROSCOPY/STONE EXTRACTION WITH BASKET;  Surgeon: Garnett Farm, MD;  Location: [facility];  Service: Urology;  Laterality: Left;  . EXTRACORPOREAL SHOCK WAVE LITHOTRIPSY  08-03-2011   LEFT  . PULLEY RELEASE RIGHT RING FINGER/ LIMITED SYNOVECTOMY  12-26-2009  . TRIGGER  FINGER RELEASE  06/02/2011-  LOCAL ONLY   Procedure: MINOR RELEASE TRIGGER FINGER/A-1 PULLEY;  Surgeon: Wyn Forster., MD;  Location: [facility];  Service: Orthopedics;  Laterality: Left;  left ring     Current Outpatient Medications  Medication Sig Dispense Refill  . aspirin 81 MG tablet Take 81 mg by mouth daily.     Marland Kitchen atorvastatin (LIPITOR) 40 MG tablet TAKE ONE TABLET BY MOUTH ONE TIME DAILY IN THE P.M. 90 tablet 1  . metoprolol tartrate (LOPRESSOR) 50 MG tablet Take 0.5 tablets (25 mg total) by mouth 2 (two) times daily. 90 tablet 1  . Multiple Vitamin (MULTIVITAMIN) tablet Take 1 tablet by mouth daily.    . nitroGLYCERIN (NITROSTAT) 0.4 MG SL tablet Place 0.4 mg under the tongue every 5 (five) minutes as needed.     Marland Kitchen telmisartan (MICARDIS) 80 MG tablet Take 1 tablet (80 mg total) by mouth daily. 90 tablet 1   No current facility-administered medications for this visit.     Allergies:   Lisinopril and Zocor [simvastatin]    Social History:  The patient  reports that he has quit smoking. His smoking use included cigarettes. he has never used smokeless tobacco. He reports that he drinks alcohol. He reports that he does not use drugs.   Family History:  The patient's family history includes Heart attack in his father.    ROS:  Noted in current history, all other systems are negative.   Physical Exam: Blood pressure 124/64, pulse 77, height 5\' 3"  (1.6 m), weight 155 lb 12.8 oz (70.7 kg), SpO2 96 %.  GEN:  Well nourished, well developed in no acute distress HEENT: Normal NECK: No JVD; No carotid bruits heard today  ( has hx of R carotid bruit in the past)  LYMPHATICS: No lymphadenopathy CARDIAC: RR, no murmurs RESPIRATORY:  Clear to auscultation without rales, wheezing or rhonchi  ABDOMEN: Soft, non-tender, non-distended MUSCULOSKELETAL:  No edema; No deformity  SKIN: Warm and dry NEUROLOGIC:  Alert and oriented x 3  EKG:    Recent Labs: 09/14/2016:  ALT 37; BUN 18; Creatinine, Ser 0.90; Potassium 5.0; Sodium 141    Lipid Panel    Component Value Date/Time   CHOL 139 09/14/2016 0925   TRIG 85 09/14/2016 0925   HDL 56 09/14/2016 0925   CHOLHDL 2.5 09/14/2016 0925   CHOLHDL 2.4 12/23/2015 1125   VLDL 14 12/23/2015 1125   LDLCALC 66 09/14/2016 0925      Wt Readings from Last 3 Encounters:  03/16/17 155 lb 12.8 oz (70.7 kg)  09/14/16 156 lb 12.8 oz (71.1 kg)  12/23/15 159 lb (  72.1 kg)      Other studies Reviewed: Additional studies/ records that were reviewed today include: . Review of the above records demonstrates:    ASSESSMENT AND PLAN:  1. CAD -  Doing well.   Continue current meds We will see him again in 1 year for follow-up visit.  2. Hyperlipidemia -check fasting lipids, liver enzymes, basic medical profile today and in 1 year.  3.  Carotid artery disease. - has a soft right carotid bruit in the past.  I did not hear any bruit today   .  Carotid duplex showed mild plaque with recommendations to repeat in 2 years.   4. Achilles tendinitis:    Current medicines are reviewed at length with the patient today.  The patient does not have concerns regarding medicines.  The following changes have been made:  no change   Disposition:   FU with me in  1 year .     Signed, Kristeen Miss, MD  03/16/2017 11:43 AM    [facility] [address] [place], [place], [place]  [address] Phone: [phone]; Fax: [phone]

## 2017-03-16 NOTE — Patient Instructions (Signed)
Medication Instructions:  Your physician recommends that you continue on your current medications as directed. Please refer to the Current Medication list given to you today.   Labwork: TODAY - cholesterol, liver panel, basic metabolic panel  Your physician recommends that you return for lab work in: 1 year on the day of or a few days before your office visit with Dr. Elease HashimotoNahser.  You will need to FAST for this appointment - nothing to eat or drink after midnight the night before except water.   Testing/Procedures: None Ordered   Follow-Up: Your physician wants you to follow-up in: 1 year with Dr. Elease HashimotoNahser.  You will receive a reminder letter in the mail two months in advance. If you don't receive a letter, please call our office to schedule the follow-up appointment.   If you need a refill on your cardiac medications before your next appointment, please call your pharmacy.   Thank you for choosing [facility]! Eligha BridegroomMichelle Swinyer, RN [phone]

## 2017-03-17 LAB — HEPATIC FUNCTION PANEL
ALK PHOS: 84 IU/L (ref 39–117)
ALT: 46 IU/L — ABNORMAL HIGH (ref 0–44)
AST: 34 IU/L (ref 0–40)
Albumin: 4.3 g/dL (ref 3.5–4.8)
Bilirubin Total: 0.6 mg/dL (ref 0.0–1.2)
Bilirubin, Direct: 0.16 mg/dL (ref 0.00–0.40)
TOTAL PROTEIN: 6.8 g/dL (ref 6.0–8.5)

## 2017-03-17 LAB — LIPID PANEL
Chol/HDL Ratio: 2.5 ratio (ref 0.0–5.0)
Cholesterol, Total: 143 mg/dL (ref 100–199)
HDL: 57 mg/dL (ref >39)
LDL CALC: 71 mg/dL (ref 0–99)
Triglycerides: 73 mg/dL (ref 0–149)
VLDL CHOLESTEROL CAL: 15 mg/dL (ref 5–40)

## 2017-03-17 LAB — BASIC METABOLIC PANEL
BUN / CREAT RATIO: 18 (ref 10–24)
BUN: 16 mg/dL (ref 8–27)
CO2: 25 mmol/L (ref 20–29)
CREATININE: 0.87 mg/dL (ref 0.76–1.27)
Calcium: 9.3 mg/dL (ref 8.6–10.2)
Chloride: 104 mmol/L (ref 96–106)
GFR calc Af Amer: 98 mL/min/{1.73_m2} (ref >59)
GFR calc non Af Amer: 85 mL/min/{1.73_m2} (ref >59)
GLUCOSE: 119 mg/dL — AB (ref 65–99)
Potassium: 4.8 mmol/L (ref 3.5–5.2)
SODIUM: 143 mmol/L (ref 134–144)

## 2017-05-04 ENCOUNTER — Ambulatory Visit: Payer: BC Managed Care – PPO | Admitting: Cardiovascular Disease

## 2017-06-28 IMAGING — MR MR SHOULDER*L* W/O CM
5 series · 32 of 40 positions shown · non-contrast
Comparison: None.

CLINICAL DATA: Fell 3 weeks ago. Limited range of motion and
persistent shoulder pain.

EXAM:
MRI OF THE LEFT SHOULDER WITHOUT CONTRAST
TECHNIQUE: Multiplanar, multisequence MR imaging of the shoulder was performed.
No intravenous contrast was administered.

[Series 3: T2 fat-sat · axial · 4.0mm · 0.55mm/px · z∈[-42,+55]mm · 8 of 23 slices shown (1 of 3)]
[im 1/23]
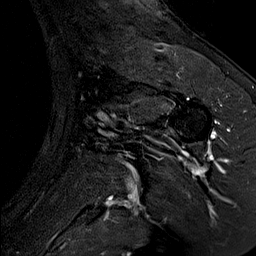
[im 3/23]
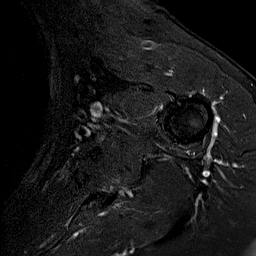
[im 8/23]
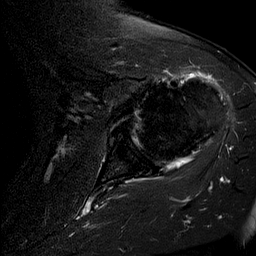
[im 10/23]
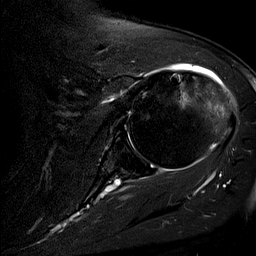
[im 13/23]
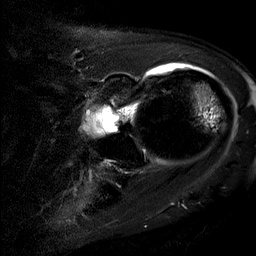
[im 15/23]
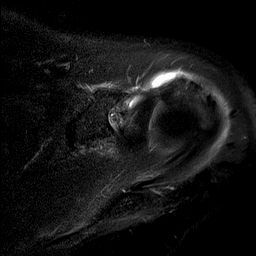
[im 20/23]
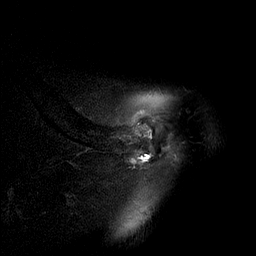
[im 23/23]
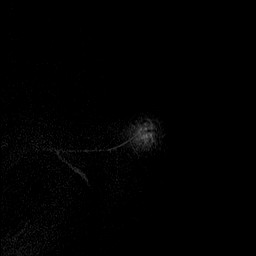

[Series 4: T2 fat-sat · oblique · 4.0mm · 0.55mm/px · 8 of 20 slices shown (2 of 3)]
[im 1/20]
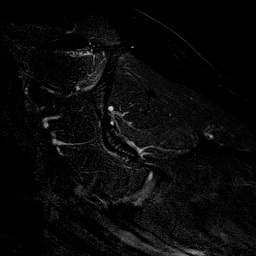
[im 3/20]
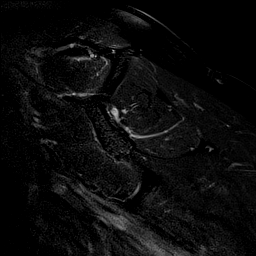
[im 6/20]
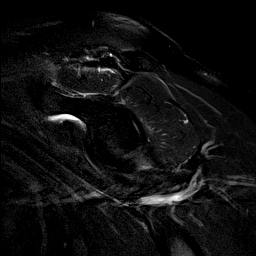
[im 9/20]
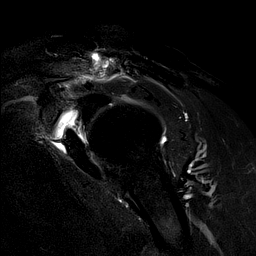
[im 11/20]
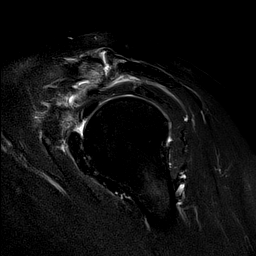
[im 14/20]
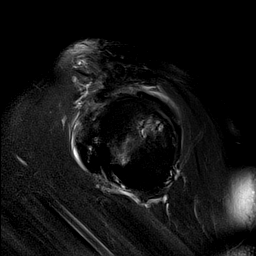
[im 17/20]
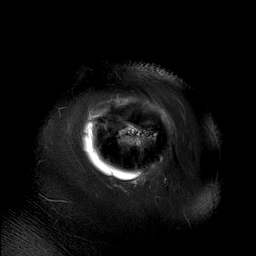
[im 20/20]
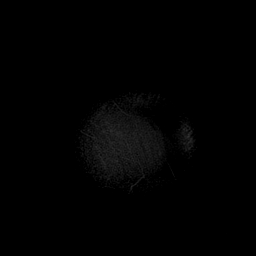

[Series 5: T1 · oblique · 4.0mm · 0.22mm/px · 2 of 20 slices shown]
[im 1/20]
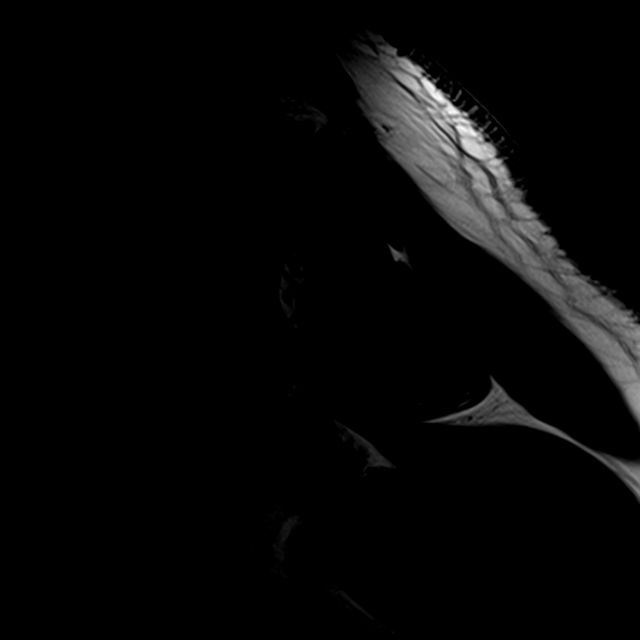
[im 3/20]
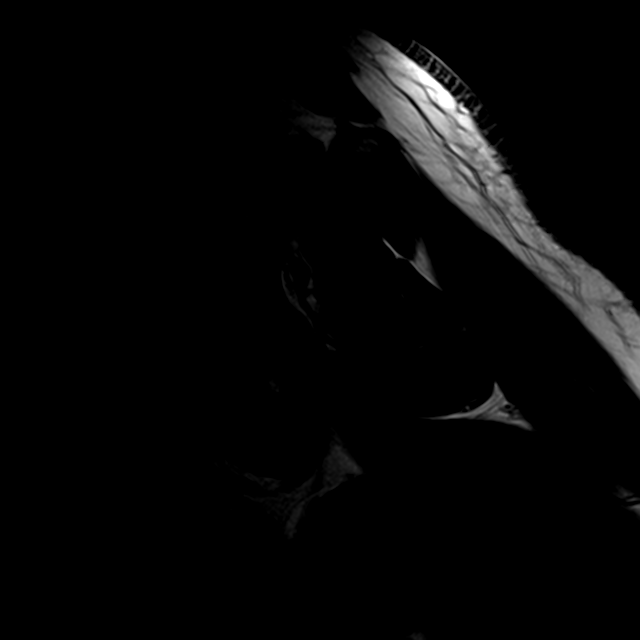

[Series 6: T2 fat-sat · oblique · 4.0mm · 0.27mm/px · 7 of 17 slices shown (3 of 3)]
[im 1/17]
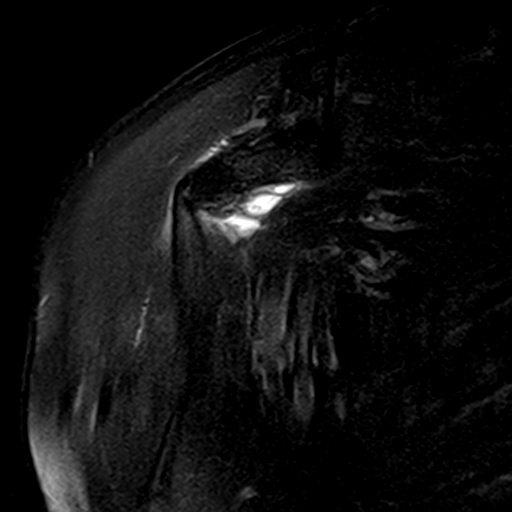
[im 3/17]
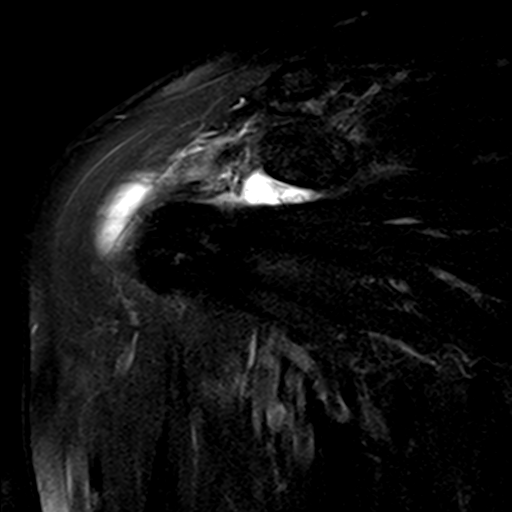
[im 6/17]
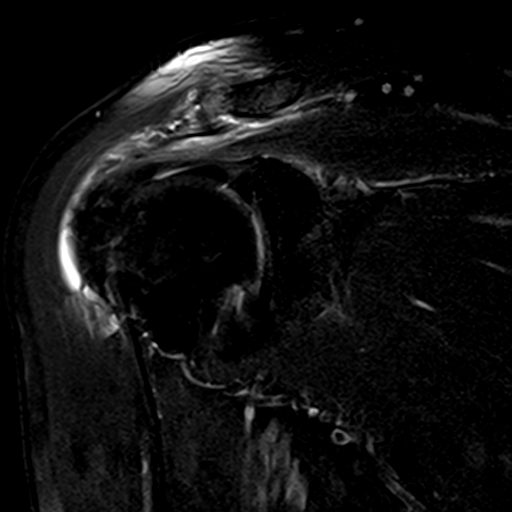
[im 9/17]
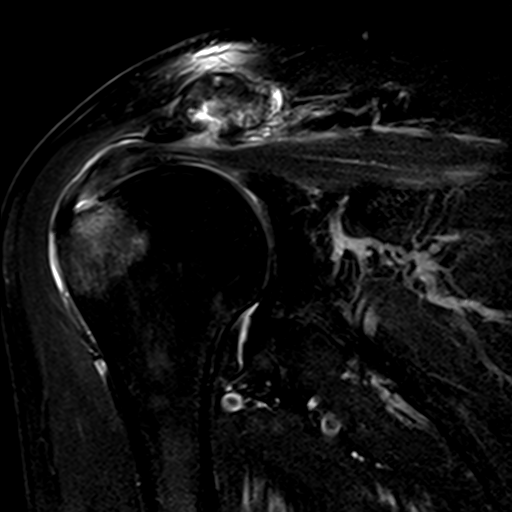
[im 11/17]
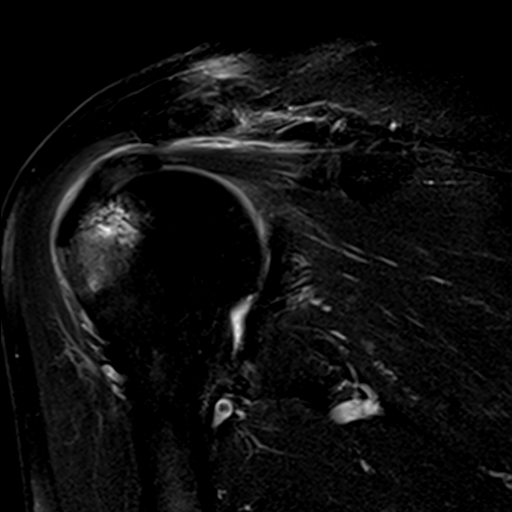
[im 14/17]
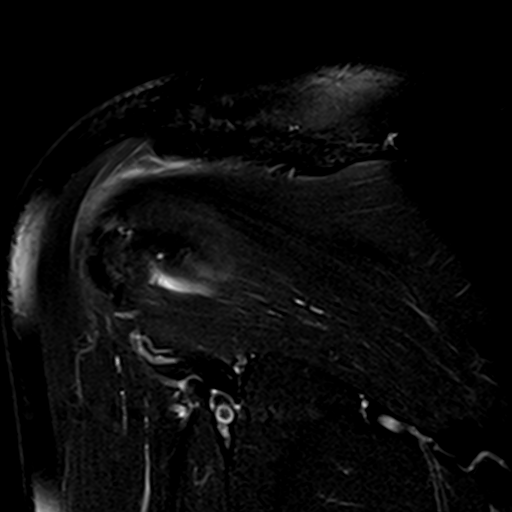
[im 17/17]
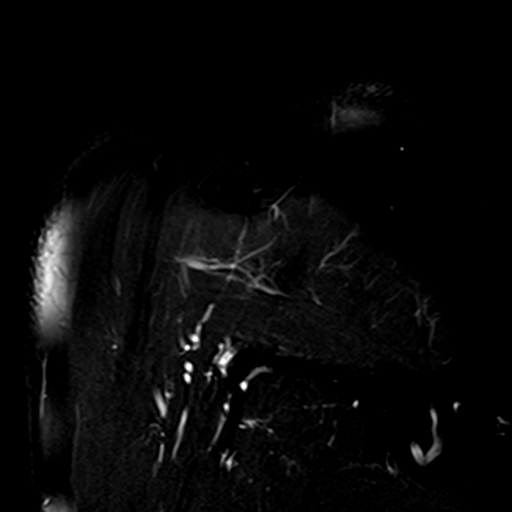

[Series 7: PD · oblique · 4.0mm · 0.44mm/px · 7 of 17 slices shown]
[im 1/17]
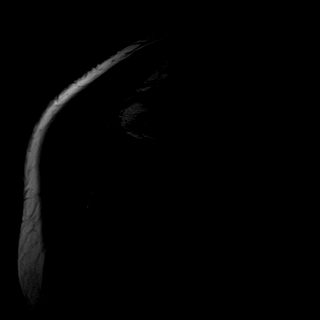
[im 3/17]
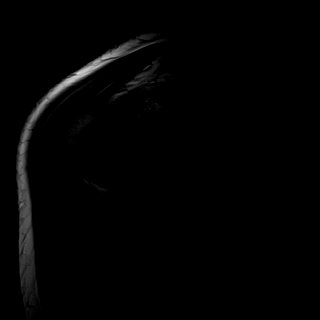
[im 6/17]
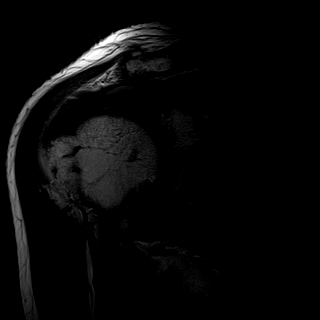
[im 9/17]
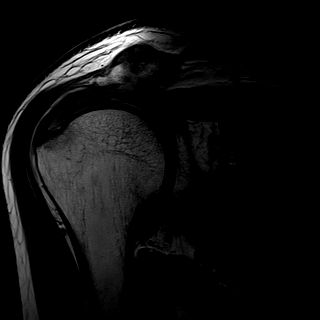
[im 11/17]
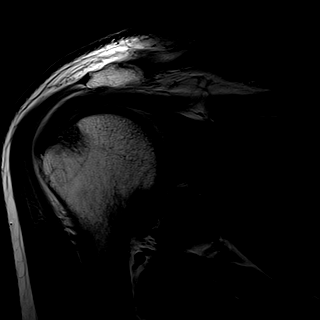
[im 14/17]
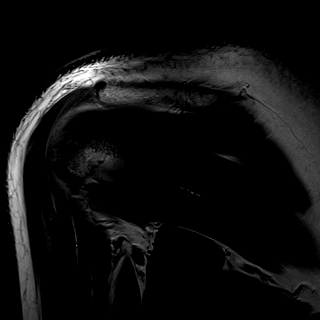
[im 17/17]
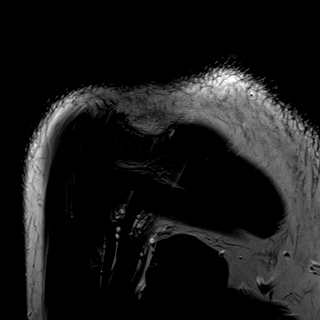

[32 of 40 positions shown; findings below may reference images not displayed]

FINDINGS: Rotator cuff: Severe rotator cuff tendinopathy/tendinosis mainly
involving the infraspinatus and supraspinatus tendons.
Intrasubstance tearing, thickening and edema. There is a small rim
rent type articular surface tear involving the supraspinous tendon
near its attachment site. No full-thickness retracted tear. Areas of
bursal surface irregularity, likely fraying and fibrillation.

The subscapularis tendon is intact.

Muscles:  Normal

Biceps long head:  Intact

Acromioclavicular Joint: Advanced degenerative changes. Type II
acromion. No lateral downsloping or undersurface spurring.

Glenohumeral Joint: Moderate degenerative changes. Small joint
effusion. There is thickening of the capsular structures in the
axillary recess which can be seen with adhesive capsulitis or
synovitis.

Labrum:  Labral degenerative changes without obvious tear.

Bones: Marrow edema involving the greater tuberosity likely a
combination of subchondral cystic degenerative changes or
intraosseous ganglion and possible bone contusion. No fracture is
identified.

Other: Moderate subacromial/ subdeltoid bursitis.
IMPRESSION: 1. Significant supraspinatus and infraspinatus
tendinopathy/tendinosis with interstitial tears, bursal surface
fraying and a small rim rent articular surface tear involving the
supraspinous tendon. No full-thickness retracted tear but probably
at risk for such.
2. Advanced AC joint degenerative changes but no other significant
findings for bony impingement.
3. Subchondral cystic type degenerative changes involving the
greater tuberosity versus intraosseous ganglion. Could not exclude a
superimposed bone contusion without fracture.
4. There is thickening of the capsular structures in the axillary
recess which can be seen with adhesive capsulitis or synovitis.
5. Significant subacromial/subdeltoid bursitis.

## 2017-08-16 ENCOUNTER — Other Ambulatory Visit: Payer: Self-pay | Admitting: Cardiovascular Disease

## 2018-03-16 ENCOUNTER — Encounter: Payer: Self-pay | Admitting: Cardiovascular Disease

## 2018-03-16 ENCOUNTER — Other Ambulatory Visit: Payer: BC Managed Care – PPO | Admitting: *Deleted

## 2018-03-16 ENCOUNTER — Ambulatory Visit: Payer: BC Managed Care – PPO | Admitting: Cardiovascular Disease

## 2018-03-16 VITALS — BP 154/82 | HR 66 | Ht 63.0 in | Wt 151.4 lb

## 2018-03-16 DIAGNOSIS — I10 Essential (primary) hypertension: Secondary | ICD-10-CM | POA: Diagnosis not present

## 2018-03-16 DIAGNOSIS — I2510 Atherosclerotic heart disease of native coronary artery without angina pectoris: Secondary | ICD-10-CM | POA: Diagnosis not present

## 2018-03-16 DIAGNOSIS — E782 Mixed hyperlipidemia: Secondary | ICD-10-CM

## 2018-03-16 DIAGNOSIS — R0989 Other specified symptoms and signs involving the circulatory and respiratory systems: Secondary | ICD-10-CM

## 2018-03-16 LAB — HEPATIC FUNCTION PANEL
ALBUMIN: 4.7 g/dL (ref 3.7–4.7)
ALK PHOS: 78 IU/L (ref 39–117)
ALT: 29 IU/L (ref 0–44)
AST: 31 IU/L (ref 0–40)
Bilirubin Total: 0.4 mg/dL (ref 0.0–1.2)
Bilirubin, Direct: 0.13 mg/dL (ref 0.00–0.40)
TOTAL PROTEIN: 6.7 g/dL (ref 6.0–8.5)

## 2018-03-16 LAB — BASIC METABOLIC PANEL
BUN/Creatinine Ratio: 14 (ref 10–24)
BUN: 12 mg/dL (ref 8–27)
CO2: 24 mmol/L (ref 20–29)
CREATININE: 0.88 mg/dL (ref 0.76–1.27)
Calcium: 9.6 mg/dL (ref 8.6–10.2)
Chloride: 104 mmol/L (ref 96–106)
GFR, EST AFRICAN AMERICAN: 97 mL/min/{1.73_m2} (ref >59)
GFR, EST NON AFRICAN AMERICAN: 84 mL/min/{1.73_m2} (ref >59)
GLUCOSE: 116 mg/dL — AB (ref 65–99)
Potassium: 4.6 mmol/L (ref 3.5–5.2)
SODIUM: 143 mmol/L (ref 134–144)

## 2018-03-16 LAB — LIPID PANEL
CHOL/HDL RATIO: 2.3 ratio (ref 0.0–5.0)
Cholesterol, Total: 124 mg/dL (ref 100–199)
HDL: 54 mg/dL (ref >39)
LDL CALC: 58 mg/dL (ref 0–99)
Triglycerides: 60 mg/dL (ref 0–149)
VLDL Cholesterol Cal: 12 mg/dL (ref 5–40)

## 2018-03-16 NOTE — Progress Notes (Signed)
kg

## 2018-03-16 NOTE — Progress Notes (Signed)
Cardiology Office Note   Date:  03/16/2018   ID:  Rodney Hester, Rodney Hester 1940-09-05, MRN [id number]  PCP:  Rodney Funk, MD  Cardiologist:   Rodney Miss, MD   Chief Complaint  Patient presents with  . Coronary Artery Disease  . Hypertension   1. CAD - s/p inferior wall myocardial infarction in 1999-status post stent placement to the right coronary artery. He is also status post stent placement to the first diagonal artery in March, 2000. At that time he had angioplasty to the LAD because of "snow plowing of plaque down the LAD from the 1st diagonal.  2. Hyperlipidemia 3. Kidney stone- left kidney,       78 y.o. year-old gentleman with a history of coronary artery disease. Status post myocardial infarction in 1998. He has done very well. He has not had any episodes of angina. He works out on a daily basis.  He had a kidney stone this summer. He eventually required extraction of the stone by Dr. Charlyn Hester. [name] feeling quite a bit better and is now back doing his normal workouts.  July 14, 2012:  Rodney Hester is doing well. No CP or dyspnea. He has had some plantar fascitis and has not been working out much. He also has had a kidney stone removed and had hand surgery.   He has returned to work. 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  Jan. 5, 2014:  He has retired (again). He started back working and was not able to work out and gained weight. No CP or dyspnea.   Sept. 29, 2015:  Rodney Hester has had a cold and cough for the past 3 months. Is taking Afrin. BP is a bit high.  Still battling plantar fascitis.   He has started working again. ( 3rd career) He has already retired twice.   Feb. 25, 2015  Rodney Hester is a 78 y.o. male who presents for follow-up of his coronary artery disease. Rodney Hester is doing well. Had nasal surgery Feb. 9.    Had some chest pain after doing some pushs recently.    He thinks it was a muscular  issue.   Has had plantas fascitis.  He thinks it was due to his old orthotics.  His foot pain is better.   Dec. 2, 2016:    Rodney Hester is doing ok from a cardiac standpoint Has had achilles tendonitis.   June, 1, 2017:  Doing well from a cardiac standpoint.   Still has achilles tendonitis . Very slow getting better .  BP looks good No CP or dyspnea   Dec. 4, 2017:    Rodney Hester is seen today for follow up visit  Is doing much better.   Has been treating achilies tendonitits for the past 15 months .   Doing well.    Aug. 27, 2018:  Rodney Hester is seen today  Still getting over left shoulder surgery.   Is back exercising  Walks 4 miles a day , 6 days a week.   March 16, 2017: Rodney Hester is doing well.  Still active. Having right knee issues , twisted his knee  No angina   March 16, 2018: Seen today for follow-up of his coronary artery disease.  He also has hypertension and hyperlipidemia.  BP is up today ( he received some bad news this am )  BP has been well controlled.  Walks regularly   Recent labs from Dr. Jone Hester office reveal a total cholesterol of 122.  The  HDL is 49.  The LDL is 60.  The triglyceride level 61.  Past Medical History:  Diagnosis Date  . Coronary artery disease CARDIOLOGIST- DR Rodney Hester   MI 8/99  (09-08-2011  PT DENIES S & S)  . History of acute inferior wall myocardial infarction 1999-  S/P STENT RCA  . Hyperlipidemia   . Hypertension   . Left ureteral calculus   . S/P primary angioplasty with coronary stent     Past Surgical History:  Procedure Laterality Date  . ANTERIOR CRUCIATE LIGAMENT REPAIR  1970'S   LEFT KNEE  . CARDIOVASCULAR STRESS TEST  2011  DR Rodney Hester   PER PT NORMAL  . CORONARY ANGIOPLASTY  NOV 1999   FIRST DIAGONAL BRANCH  . CORONARY ANGIOPLASTY WITH STENT PLACEMENT  AUG 1999   STENTING RCA  . CORONARY ANGIOPLASTY WITH STENT PLACEMENT  MAR 2000   STENTING FIRST DIAGONAL FOR RESTENOSIS  AND PCI OF LAD  .  CYSTOSCOPY/RETROGRADE/URETEROSCOPY/STONE EXTRACTION WITH BASKET  09/11/2011   Procedure: CYSTOSCOPY/RETROGRADE/URETEROSCOPY/STONE EXTRACTION WITH BASKET;  Surgeon: Rodney Farm, MD;  Location: [facility];  Service: Urology;  Laterality: Left;  . EXTRACORPOREAL SHOCK WAVE LITHOTRIPSY  08-03-2011   LEFT  . PULLEY RELEASE RIGHT RING FINGER/ LIMITED SYNOVECTOMY  12-26-2009  . TRIGGER FINGER RELEASE  06/02/2011-  LOCAL ONLY   Procedure: MINOR RELEASE TRIGGER FINGER/A-1 PULLEY;  Surgeon: Rodney Hester., MD;  Location: [facility];  Service: Orthopedics;  Laterality: Left;  left ring     Current Outpatient Medications  Medication Sig Dispense Refill  . aspirin 81 MG tablet Take 81 mg by mouth daily.     Marland Kitchen atorvastatin (LIPITOR) 40 MG tablet TAKE ONE TABLET BY MOUTH ONE TIME DAILY IN THE P.M. 90 tablet 2  . metoprolol tartrate (LOPRESSOR) 50 MG tablet TAKE 1/2 TABLET BY MOUTH TWICE A DAY 90 tablet 2  . Multiple Vitamin (MULTIVITAMIN) tablet Take 1 tablet by mouth daily.    . nitroGLYCERIN (NITROSTAT) 0.4 MG SL tablet Place 0.4 mg under the tongue every 5 (five) minutes as needed.     Marland Kitchen telmisartan (MICARDIS) 80 MG tablet TAKE 1 TABLET BY MOUTH EVERY DAY 90 tablet 2   No current facility-administered medications for this visit.     Allergies:   Lisinopril and Zocor [simvastatin]    Social History:  The patient  reports that he has quit smoking. His smoking use included cigarettes. He has never used smokeless tobacco. He reports current alcohol use. He reports that he does not use drugs.   Family History:  The patient's family history includes Heart attack in his father.    ROS:  Noted in current history, all other systems are negative.   Physical Exam: Blood pressure (!) 154/82, pulse 66, height 5\' 3"  (1.6 m), weight 151 lb 6.4 oz (68.7 kg), SpO2 98 %.  GEN:  Well nourished, well developed in no acute distress HEENT: Normal NECK: No JVD; No carotid  bruits LYMPHATICS: No lymphadenopathy CARDIAC: RRR   RESPIRATORY:  Clear to auscultation without rales, wheezing or rhonchi  ABDOMEN: Soft, non-tender, non-distended MUSCULOSKELETAL:  No edema; No deformity  SKIN: Warm and dry NEUROLOGIC:  Alert and oriented x 3   EKG:    March 16, 2018: Normal sinus rhythm at 67.  No ST or T wave changes.  Recent Labs: 03/16/2017: ALT 46; BUN 16; Creatinine, Ser 0.87; Potassium 4.8; Sodium 143    Lipid Panel    Component Value Date/Time  CHOL 143 03/16/2017 1206   TRIG 73 03/16/2017 1206   HDL 57 03/16/2017 1206   CHOLHDL 2.5 03/16/2017 1206   CHOLHDL 2.4 12/23/2015 1125   VLDL 14 12/23/2015 1125   LDLCALC 71 03/16/2017 1206      Wt Readings from Last 3 Encounters:  03/16/18 151 lb 6.4 oz (68.7 kg)  03/16/17 155 lb 12.8 oz (70.7 kg)  09/14/16 156 lb 12.8 oz (71.1 kg)      Other studies Reviewed: Additional studies/ records that were reviewed today include: . Review of the above records demonstrates:    ASSESSMENT AND PLAN:  1. CAD -   he is walking regularly.  Not having any episodes of angina.  2. Hyperlipidemia -he had his lipids checked by Dr. Valentina Lucks recently.  His levels look great.  Continue current medications.  3.  Carotid artery disease. -   We will repeat his duplex scan next year.  4. Achilles tendinitis:    Current medicines are reviewed at length with the patient today.  The patient does not have concerns regarding medicines.  The following changes have been made:  no change   Disposition:   FU with me in  1 year .     Signed, Rodney Miss, MD  03/16/2018 9:35 AM    [facility] [address] [place], [place], [place]  [address] Phone: [phone]; Fax: [phone]

## 2018-03-16 NOTE — Patient Instructions (Signed)
Medication Instructions:  Your physician recommends that you continue on your current medications as directed. Please refer to the Current Medication list given to you today.  If you need a refill on your cardiac medications before your next appointment, please call your pharmacy.   Lab work: None ordered  If you have labs (blood work) drawn today and your tests are completely normal, you will receive your results only by: . MyChart Message (if you have MyChart) OR . A paper copy in the mail If you have any lab test that is abnormal or we need to change your treatment, we will call you to review the results.  Testing/Procedures: None ordered   Follow-Up: At [facility], you and your health needs are our priority.  As part of our continuing mission to provide you with exceptional heart care, we have created designated Provider Care Teams.  These Care Teams include your primary Cardiologist (physician) and Advanced Practice Providers (APPs -  Physician Assistants and Nurse Practitioners) who all work together to provide you with the care you need, when you need it. You will need a follow up appointment in:  12 months.  Please call our office 2 months in advance to schedule this appointment.  You may see Philip Nahser, MD or one of the following Advanced Practice Providers on your designated Care Team: Scott Weaver, PA-C Vin Bhagat, PA-C . Janine Hammond, NP  Any Other Special Instructions Will Be Listed Below (If Applicable).    

## 2018-05-17 ENCOUNTER — Other Ambulatory Visit: Payer: Self-pay | Admitting: Cardiovascular Disease

## 2018-06-06 ENCOUNTER — Other Ambulatory Visit: Payer: Self-pay | Admitting: Cardiovascular Disease

## 2019-02-13 ENCOUNTER — Other Ambulatory Visit: Payer: Self-pay | Admitting: Cardiovascular Disease

## 2019-03-05 ENCOUNTER — Ambulatory Visit: Payer: BC Managed Care – PPO | Attending: Internal Medicine

## 2019-03-05 DIAGNOSIS — Z23 Encounter for immunization: Secondary | ICD-10-CM | POA: Insufficient documentation

## 2019-03-05 NOTE — Progress Notes (Signed)
   Covid-19 Vaccination Clinic  Name:  Rodney Hester    MRN: [id number] DOB: 1940-04-10  03/05/2019  [name] was observed post Covid-19 immunization for 15 minutes without incidence. He was provided with Vaccine Information Sheet and instruction to access the V-Safe system.   [name] was instructed to call 911 with any severe reactions post vaccine: Marland Kitchen Difficulty breathing  . Swelling of your face and throat  . A fast heartbeat  . A bad rash all over your body  . Dizziness and weakness    Immunizations Administered    Name Date Dose VIS Date Route   Pfizer COVID-19 Vaccine 03/05/2019  8:53 AM 0.3 mL 12/30/2018 Intramuscular   Manufacturer: [employer], [employer]   Lot: [id number]   NDC: [address]-1

## 2019-03-17 ENCOUNTER — Ambulatory Visit: Payer: BC Managed Care – PPO | Admitting: Cardiovascular Disease

## 2019-03-17 ENCOUNTER — Encounter: Payer: Self-pay | Admitting: Cardiovascular Disease

## 2019-03-17 ENCOUNTER — Other Ambulatory Visit: Payer: Self-pay

## 2019-03-17 VITALS — BP 135/72 | HR 62 | Ht 63.0 in | Wt 155.0 lb

## 2019-03-17 DIAGNOSIS — I779 Disorder of arteries and arterioles, unspecified: Secondary | ICD-10-CM | POA: Diagnosis not present

## 2019-03-17 DIAGNOSIS — E782 Mixed hyperlipidemia: Secondary | ICD-10-CM | POA: Diagnosis not present

## 2019-03-17 DIAGNOSIS — I10 Essential (primary) hypertension: Secondary | ICD-10-CM

## 2019-03-17 DIAGNOSIS — I2510 Atherosclerotic heart disease of native coronary artery without angina pectoris: Secondary | ICD-10-CM

## 2019-03-17 NOTE — Patient Instructions (Addendum)
Medication Instructions:  The current medical regimen is effective;  continue present plan and medications.  *If you need a refill on your cardiac medications before your next appointment, please call your pharmacy*  Test:   Your physician has requested that you have a carotid duplex in 1 year. This test is an ultrasound of the carotid arteries in your neck. It looks at blood flow through these arteries that supply the brain with blood. Allow one hour for this exam. There are no restrictions or special instructions.  Follow-Up: At [facility], you and your health needs are our priority.  As part of our continuing mission to provide you with exceptional heart care, we have created designated Provider Care Teams.  These Care Teams include your primary Cardiologist (physician) and Advanced Practice Providers (APPs -  Physician Assistants and Nurse Practitioners) who all work together to provide you with the care you need, when you need it.  We recommend signing up for the patient portal called "MyChart".  Sign up information is provided on this After Visit Summary.  MyChart is used to connect with patients for Virtual Visits (Telemedicine).  Patients are able to view lab/test results, encounter notes, upcoming appointments, etc.  Non-urgent messages can be sent to your provider as well.   To learn more about what you can do with MyChart, go to [email or web address].    Your next appointment:   12 month(s)  The format for your next appointment:   In Person  Provider:   Kristeen Miss, MD   Thank you for choosing [facility]!!

## 2019-03-17 NOTE — Progress Notes (Signed)
Cardiology Office Note   Date:  03/17/2019   ID:  Rodney Hester, Rodney Hester 1940/03/11, MRN [id number]  PCP:  Rodney Funk, MD  Cardiologist:   Rodney Miss, MD   No chief complaint on file.  1. CAD - s/p inferior wall myocardial infarction in 1999-status post stent placement to the right coronary artery. He is also status post stent placement to the first diagonal artery in March, 2000. At that time he had angioplasty to the LAD because of "snow plowing of plaque down the LAD from the 1st diagonal.  2. Hyperlipidemia 3. Kidney stone- left kidney,    79 y.o. year-old gentleman with a history of coronary artery disease. Status post myocardial infarction in 1998. He has done very well. He has not had any episodes of angina. He works out on a daily basis.  He had a kidney stone this summer. He eventually required extraction of the stone by Rodney Hester. [name] feeling quite a bit better and is now back doing his normal workouts.  July 14, 2012:  Rodney Hester is doing well. No CP or dyspnea. He has had some plantar fascitis and has not been working out much. He also has had a kidney stone removed and had hand surgery.   He has returned to work. 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  Jan. 5, 2014:  He has retired (again). He started back working and was not able to work out and gained weight. No CP or dyspnea.   Sept. 29, 2015:  Rodney Hester has had a cold and cough for the past 3 months. Is taking Afrin. BP is a bit high.  Still battling plantar fascitis.   He has started working again. ( 3rd career) He has already retired twice.   Feb. 25, 2015  Rodney Hester is a 79 y.o. male who presents for follow-up of his coronary artery disease. Rodney Hester is doing well. Had nasal surgery Feb. 9.    Had some chest pain after doing some pushs recently.    He thinks it was a muscular issue.   Has had plantas fascitis.  He thinks it was due to his old  orthotics.  His foot pain is better.   Dec. 2, 2016:    Rodney Hester is doing ok from a cardiac standpoint Has had achilles tendonitis.   June, 1, 2017:  Doing well from a cardiac standpoint.   Still has achilles tendonitis . Very slow getting better .  BP looks good No CP or dyspnea   Dec. 4, 2017:    Rodney Hester is seen today for follow up visit  Is doing much better.   Has been treating achilies tendonitits for the past 15 months .   Doing well.    Aug. 27, 2018:  Rodney Hester is seen today  Still getting over left shoulder surgery.   Is back exercising  Walks 4 miles a day , 6 days a week.   March 16, 2017: Rodney Hester is doing well.  Still active. Having right knee issues , twisted his knee  No angina   March 16, 2018: Seen today for follow-up of his coronary artery disease.  He also has hypertension and hyperlipidemia.  BP is up today ( he received some bad news this am )  BP has been well controlled.  Walks regularly   Recent labs from Rodney Hester office reveal a total cholesterol of 122.  The HDL is 49.  The LDL is 60.  The triglyceride level 61.  Feb. 26, 2021  Doing very well.  Walking.  Working out with Rodney Hester.  Push ups  Has been on a healthy diet  Weight has been stable  No CP , dyspnea, syncope, presyncope   He saw Rodney Hester in January. Labs from that visit reveal a total cholesterol of 133.  HDL is 48.  LDL 69.  Triglycerides are 81.  Hemoglobin A1c is 6.2.  Potassium is 4.5.   Past Medical History:  Diagnosis Date  . Coronary artery disease CARDIOLOGIST- DR Rodney Hester   MI 8/99  (09-08-2011  PT DENIES S & S)  . History of acute inferior wall myocardial infarction 1999-  S/P STENT RCA  . Hyperlipidemia   . Hypertension   . Left ureteral calculus   . S/P primary angioplasty with coronary stent     Past Surgical History:  Procedure Laterality Date  . ANTERIOR CRUCIATE LIGAMENT REPAIR  1970'S   LEFT KNEE  . CARDIOVASCULAR STRESS TEST  2011   DR Rodney Hester   PER PT NORMAL  . CORONARY ANGIOPLASTY  NOV 1999   FIRST DIAGONAL BRANCH  . CORONARY ANGIOPLASTY WITH STENT PLACEMENT  AUG 1999   STENTING RCA  . CORONARY ANGIOPLASTY WITH STENT PLACEMENT  MAR 2000   STENTING FIRST DIAGONAL FOR RESTENOSIS  AND PCI OF LAD  . CYSTOSCOPY/RETROGRADE/URETEROSCOPY/STONE EXTRACTION WITH BASKET  09/11/2011   Procedure: CYSTOSCOPY/RETROGRADE/URETEROSCOPY/STONE EXTRACTION WITH BASKET;  Surgeon: Rodney Jabs, MD;  Location: [facility];  Service: Urology;  Laterality: Left;  . EXTRACORPOREAL SHOCK WAVE LITHOTRIPSY  08-03-2011   LEFT  . PULLEY RELEASE RIGHT RING FINGER/ LIMITED SYNOVECTOMY  12-26-2009  . TRIGGER FINGER RELEASE  06/02/2011-  LOCAL ONLY   Procedure: MINOR RELEASE TRIGGER FINGER/A-1 PULLEY;  Surgeon: Rodney Hester., MD;  Location: [facility];  Service: Orthopedics;  Laterality: Left;  left ring     Current Outpatient Medications  Medication Sig Dispense Refill  . aspirin 81 MG tablet Take 81 mg by mouth daily.     Marland Kitchen atorvastatin (LIPITOR) 40 MG tablet TAKE ONE TABLET BY MOUTH ONE TIME DAILY IN THE P.M. 90 tablet 3  . metoprolol tartrate (LOPRESSOR) 50 MG tablet TAKE 1/2 TABLET BY MOUTH TWICE A DAY 90 tablet 3  . Multiple Vitamin (MULTIVITAMIN) tablet Take 1 tablet by mouth daily.    . nitroGLYCERIN (NITROSTAT) 0.4 MG SL tablet Place 0.4 mg under the tongue every 5 (five) minutes as needed.     Marland Kitchen telmisartan (MICARDIS) 80 MG tablet TAKE 1 TABLET BY MOUTH EVERY DAY 90 tablet 3   No current facility-administered medications for this visit.    Allergies:   Lisinopril and Zocor [simvastatin]    Social History:  The patient  reports that he has quit smoking. His smoking use included cigarettes. He has never used smokeless tobacco. He reports current alcohol use. He reports that he does not use drugs.   Family History:  The patient's family history includes Heart attack in his father.    ROS:  Noted in  current history, all other systems are negative.   Physical Exam: Blood pressure (!) 142/82, pulse 62, height 5\' 3"  (1.6 m), weight 155 lb (70.3 kg), SpO2 98 %.  GEN:  Well nourished, well developed in no acute distress HEENT: Normal NECK: No JVD; No carotid bruits LYMPHATICS: No lymphadenopathy CARDIAC: RRR , no murmurs, rubs, gallops RESPIRATORY:  Clear to auscultation without rales, wheezing or rhonchi  ABDOMEN: Soft, non-tender, non-distended MUSCULOSKELETAL:  No  edema; No deformity  SKIN: Warm and dry NEUROLOGIC:  Alert and oriented x 3   EKG:    Feb. 26, 2021: Normal sinus rhythm at 62.  No ST or T wave changes.  Recent Labs: No results found for requested labs within last 8760 hours.    Lipid Panel    Component Value Date/Time   CHOL 124 03/16/2018 0950   TRIG 60 03/16/2018 0950   HDL 54 03/16/2018 0950   CHOLHDL 2.3 03/16/2018 0950   CHOLHDL 2.4 12/23/2015 1125   VLDL 14 12/23/2015 1125   LDLCALC 58 03/16/2018 0950      Wt Readings from Last 3 Encounters:  03/17/19 155 lb (70.3 kg)  03/16/18 151 lb 6.4 oz (68.7 kg)  03/16/17 155 lb 12.8 oz (70.7 kg)      Other studies Reviewed: Additional studies/ records that were reviewed today include: . Review of the above records demonstrates:    ASSESSMENT AND PLAN:  1. CAD -    Tycho is doing well.  He status post stenting of the right coronary artery and first diagonal artery more than 20 years ago.  He is not had any episodes of chest pain.  He eats a healthy diet and his lipid levels have been well controlled.  Continue current medications.  2. Hyperlipidemia -his lipid levels have primarily been measured by Dr. Valentina Lucks.  His most recent levels from a month ago look good.  Continue current medications.  3.  Carotid artery disease. -    Will repeat his carotid scan before next visit I a year   Current medicines are reviewed at length with the patient today.  The patient does not have concerns regarding  medicines.  The following changes have been made:  no change   Disposition:   FU with me in  1 year .     Signed, Rodney Miss, MD  03/17/2019 9:11 AM    [facility] [address] [place], [place], [place]  [address] Phone: [phone]; Fax: [phone]

## 2019-03-28 ENCOUNTER — Ambulatory Visit: Payer: BC Managed Care – PPO | Attending: Internal Medicine

## 2019-03-28 DIAGNOSIS — Z23 Encounter for immunization: Secondary | ICD-10-CM | POA: Insufficient documentation

## 2019-03-28 NOTE — Progress Notes (Signed)
   Covid-19 Vaccination Clinic  Name:  Rodney Hester    MRN: [id number] DOB: 08-20-1940  03/28/2019  Mr. [name] was observed post Covid-19 immunization for 15 minutes without incident. He was provided with Vaccine Information Sheet and instruction to access the V-Safe system.   Mr. [name] was instructed to call 911 with any severe reactions post vaccine: Marland Kitchen Difficulty breathing  . Swelling of face and throat  . A fast heartbeat  . A bad rash all over body  . Dizziness and weakness   Immunizations Administered    Name Date Dose VIS Date Route   Pfizer COVID-19 Vaccine 03/28/2019  1:48 PM 0.3 mL 12/30/2018 Intramuscular   Manufacturer: [employer], [employer]   Lot: [id number]   NDC: [address]-9

## 2019-05-27 ENCOUNTER — Other Ambulatory Visit: Payer: Self-pay | Admitting: Cardiovascular Disease

## 2020-02-27 ENCOUNTER — Other Ambulatory Visit: Payer: Self-pay

## 2020-02-27 MED ORDER — TELMISARTAN 80 MG PO TABS: 80.0000 mg | ORAL_TABLET | Freq: Every day | ORAL | 0 refills | Status: DC

## 2020-03-07 ENCOUNTER — Other Ambulatory Visit: Payer: Self-pay

## 2020-03-07 ENCOUNTER — Ambulatory Visit (HOSPITAL_COMMUNITY)
Admission: RE | Admit: 2020-03-07 | Discharge: 2020-03-07 | Disposition: A | Discharge status: Home / Self Care | Payer: BC Managed Care – PPO | Source: Ambulatory Visit | Attending: Cardiology | Admitting: Cardiology

## 2020-03-07 DIAGNOSIS — I779 Disorder of arteries and arterioles, unspecified: Secondary | ICD-10-CM | POA: Insufficient documentation

## 2020-03-08 ENCOUNTER — Telehealth: Payer: Self-pay | Admitting: Cardiovascular Disease

## 2020-03-08 NOTE — Telephone Encounter (Signed)
Patient calling back to discuss Carotid results. Please call back

## 2020-03-17 ENCOUNTER — Encounter: Payer: Self-pay | Admitting: Cardiovascular Disease

## 2020-03-17 NOTE — Progress Notes (Signed)
Cardiology Office Note   Date:  03/18/2020   ID:  Rodney Hester, Rodney Hester [birth year], MRN [id number]  PCP:  Rodney Funk, MD  Cardiologist:   Rodney Miss, MD   Chief Complaint  Patient presents with  . Coronary Artery Disease  . Hypertension   1. CAD - s/p inferior wall myocardial infarction in 1999-status post stent placement to the right coronary artery. He is also status post stent placement to the first diagonal artery in March, 2000. At that time he had angioplasty to the LAD because of "snow plowing of plaque down the LAD from the 1st diagonal.  2. Hyperlipidemia 3. Kidney stone- left kidney,    80 y.o. year-old gentleman with a history of coronary artery disease. Status post myocardial infarction in 1998. He has done very well. He has not had any episodes of angina. He works out on a daily basis.  He had a kidney stone this summer. He eventually required extraction of the stone by Dr. Charlyn Hester. [name] feeling quite a bit better and is now back doing his normal workouts.  July 14, 2012:  Rodney Hester is doing well. No CP or dyspnea. He has had some plantar fascitis and has not been working out much. He also has had a kidney stone removed and had hand surgery.   He has returned to work. 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  Jan. 5, 2014:  He has retired (again). He started back working and was not able to work out and gained weight. No CP or dyspnea.   Sept. 29, 2015:  Rodney Hester has had a cold and cough for the past 3 months. Is taking Afrin. BP is a bit high.  Still battling plantar fascitis.   He has started working again. ( 3rd career) He has already retired twice.   Feb. 25, 2015  Rodney Hester is a 80 y.o. male who presents for follow-up of his coronary artery disease. Rodney Hester is doing well. Had nasal surgery Feb. 9.    Had some chest pain after doing some pushs recently.    He thinks it was a muscular issue.    Has had plantas fascitis.  He thinks it was due to his old orthotics.  His foot pain is better.   Dec. 2, 2016:    Rodney Hester is doing ok from a cardiac standpoint Has had achilles tendonitis.   June, 1, 2017:  Doing well from a cardiac standpoint.   Still has achilles tendonitis . Very slow getting better .  BP looks good No CP or dyspnea   Dec. 4, 2017:    Rodney Hester is seen today for follow up visit  Is doing much better.   Has been treating achilies tendonitits for the past 15 months .   Doing well.    Aug. 27, 2018:  Rodney Hester is seen today  Still getting over left shoulder surgery.   Is back exercising  Walks 4 miles a day , 6 days a week.   March 16, 2017: Rodney Hester is doing well.  Still active. Having right knee issues , twisted his knee  No angina   March 16, 2018: Seen today for follow-up of his coronary artery disease.  He also has hypertension and hyperlipidemia.  BP is up today ( he received some bad news this am )  BP has been well controlled.  Walks regularly   Recent labs from Rodney Hester office reveal a total cholesterol of 122.  The HDL is 49.  The LDL is 60.  The triglyceride level 61.  Feb. 26, 2021  Doing very well.  Walking.  Working out with [employer].  Push ups  Has been on a healthy diet  Weight has been stable  No CP , dyspnea, syncope, presyncope   He saw Rodney Hester in January. Labs from that visit reveal a total cholesterol of 133.  HDL is 48.  LDL 69.  Triglycerides are 81.  Hemoglobin A1c is 6.2.  Potassium is 4.5.   Feb. 28, 2022: Rodney Hester is seen for his CAD, HLD  BP is normal at home.  Slightly elevated here at the office.    Lost his brother and sister last year Not as active this year due to working through the estate of his brother .  Is getting back into the gym.  No angina while working out   Labs from Rodney Hester show  Chol = 150 HDL = 53 LDL = 80 Trig = 89   Past Medical History:  Diagnosis Date  .  Coronary artery disease CARDIOLOGIST- DR Rodney Hester   MI 8/99  (09-08-2011  PT DENIES S & S)  . History of acute inferior wall myocardial infarction 1999-  S/P STENT RCA  . Hyperlipidemia   . Hypertension   . Left ureteral calculus   . S/P primary angioplasty with coronary stent     Past Surgical History:  Procedure Laterality Date  . ANTERIOR CRUCIATE LIGAMENT REPAIR  1970'S   LEFT KNEE  . CARDIOVASCULAR STRESS TEST  2011  DR Rodney Hester   PER PT NORMAL  . CORONARY ANGIOPLASTY  NOV 1999   FIRST DIAGONAL BRANCH  . CORONARY ANGIOPLASTY WITH STENT PLACEMENT  AUG 1999   STENTING RCA  . CORONARY ANGIOPLASTY WITH STENT PLACEMENT  MAR 2000   STENTING FIRST DIAGONAL FOR RESTENOSIS  AND PCI OF LAD  . CYSTOSCOPY/RETROGRADE/URETEROSCOPY/STONE EXTRACTION WITH BASKET  09/11/2011   Procedure: CYSTOSCOPY/RETROGRADE/URETEROSCOPY/STONE EXTRACTION WITH BASKET;  Surgeon: Rodney Farm, MD;  Location: [facility];  Service: Urology;  Laterality: Left;  . EXTRACORPOREAL SHOCK WAVE LITHOTRIPSY  08-03-2011   LEFT  . PULLEY RELEASE RIGHT RING FINGER/ LIMITED SYNOVECTOMY  12-26-2009  . TRIGGER FINGER RELEASE  06/02/2011-  LOCAL ONLY   Procedure: MINOR RELEASE TRIGGER FINGER/A-1 PULLEY;  Surgeon: Rodney Hester., MD;  Location: [facility];  Service: Orthopedics;  Laterality: Left;  left ring     Current Outpatient Medications  Medication Sig Dispense Refill  . aspirin 81 MG tablet Take 81 mg by mouth daily.    Marland Kitchen atorvastatin (LIPITOR) 40 MG tablet TAKE ONE TABLET BY MOUTH ONE TIME DAILY IN THE P.M. 90 tablet 3  . metoprolol tartrate (LOPRESSOR) 50 MG tablet Take 0.5 tablets (25 mg total) by mouth 2 (two) times daily. 90 tablet 3  . Multiple Vitamin (MULTIVITAMIN) tablet Take 1 tablet by mouth daily.    . nitroGLYCERIN (NITROSTAT) 0.4 MG SL tablet Place 0.4 mg under the tongue every 5 (five) minutes as needed.    Marland Kitchen telmisartan (MICARDIS) 80 MG tablet Take 1 tablet (80 mg total) by  mouth daily. 90 tablet 0   No current facility-administered medications for this visit.    Allergies:   Lisinopril and Zocor [simvastatin]    Social History:  The patient  reports that he has quit smoking. His smoking use included cigarettes. He has never used smokeless tobacco. He reports current alcohol use. He reports that he does not use drugs.  Family History:  The patient's family history includes Heart attack in his father.    ROS:  Noted in current history, all other systems are negative.   Physical Exam: Blood pressure 140/72, pulse 78, height 5\' 3"  (1.6 m), weight 154 lb 3.2 oz (69.9 kg), SpO2 97 %.  GEN:  Well nourished, well developed in no acute distress HEENT: Normal NECK: No JVD; No carotid bruits LYMPHATICS: No lymphadenopathy CARDIAC: RRR , no murmurs, rubs, gallops RESPIRATORY:  Clear to auscultation without rales, wheezing or rhonchi  ABDOMEN: Soft, non-tender, non-distended MUSCULOSKELETAL:  No edema; No deformity  SKIN: Warm and dry NEUROLOGIC:  Alert and oriented x 3   EKG:     Feb. 28, 2022:  NSR at 78.  No ST or T wave changes. ,  Occasional PVC  Recent Labs: No results found for requested labs within last 8760 hours.    Lipid Panel    Component Value Date/Time   CHOL 124 03/16/2018 0950   TRIG 60 03/16/2018 0950   HDL 54 03/16/2018 0950   CHOLHDL 2.3 03/16/2018 0950   CHOLHDL 2.4 12/23/2015 1125   VLDL 14 12/23/2015 1125   LDLCALC 58 03/16/2018 0950      Wt Readings from Last 3 Encounters:  03/18/20 154 lb 3.2 oz (69.9 kg)  03/17/19 155 lb (70.3 kg)  03/16/18 151 lb 6.4 oz (68.7 kg)      Other studies Reviewed: Additional studies/ records that were reviewed today include: . Review of the above records demonstrates:    ASSESSMENT AND PLAN:  1. CAD -    No angina.   Cont meds.  Works out regularly   2. Hyperlipidemia -  Lipids were checked by his primary MD .   Labs look good.    3.  Carotid artery disease. -        Current medicines are reviewed at length with the patient today.  The patient does not have concerns regarding medicines.  The following changes have been made:  no change   Disposition:        Signed, 03/18/18, MD  03/18/2020 3:21 PM    [facility] [address] [place], [place], [place]  [place] Phone: [phone]; Fax: [phone]

## 2020-03-18 ENCOUNTER — Ambulatory Visit: Payer: BC Managed Care – PPO | Admitting: Cardiovascular Disease

## 2020-03-18 ENCOUNTER — Encounter: Payer: Self-pay | Admitting: Cardiovascular Disease

## 2020-03-18 ENCOUNTER — Other Ambulatory Visit: Payer: Self-pay

## 2020-03-18 VITALS — BP 140/72 | HR 78 | Ht 63.0 in | Wt 154.2 lb

## 2020-03-18 DIAGNOSIS — I779 Disorder of arteries and arterioles, unspecified: Secondary | ICD-10-CM | POA: Diagnosis not present

## 2020-03-18 DIAGNOSIS — E782 Mixed hyperlipidemia: Secondary | ICD-10-CM | POA: Diagnosis not present

## 2020-03-18 DIAGNOSIS — I2510 Atherosclerotic heart disease of native coronary artery without angina pectoris: Secondary | ICD-10-CM

## 2020-03-18 NOTE — Patient Instructions (Signed)

## 2020-05-27 ENCOUNTER — Other Ambulatory Visit: Payer: Self-pay | Admitting: Cardiovascular Disease

## 2021-04-20 ENCOUNTER — Encounter: Payer: Self-pay | Admitting: Cardiovascular Disease

## 2021-04-20 NOTE — Progress Notes (Signed)
? ?Cardiology Office Note ? ? ?Date:  04/21/2021  ? ?ID:  Rodney Hester, DOB 1940-03-20, MRN [id number] ? ?PCP:  Lavone Orn, MD  ?Cardiologist:   Mertie Moores, MD  ? ?Chief Complaint  ?Patient presents with  ? Hypertension  ?   ?  ? Coronary Artery Disease  ?   ?  ? ?1. CAD - s/p inferior wall myocardial infarction in 1999-status post stent placement to the right coronary artery. He is also status post stent placement to the first diagonal artery in March, 2000. At that time he had angioplasty to the LAD because of "snow plowing of plaque down the LAD from the 1st diagonal. ? ?2. Hyperlipidemia ?3. Kidney stone- left kidney,  ? ? ?81 y.o. year-old gentleman with a history of coronary artery disease. Status post myocardial infarction in 1998.  He has done very well. He has not had any episodes of angina. He works out on a daily basis. ? ?He had a kidney stone this summer. He eventually required extraction of the stone by Dr. Leonidas Romberg.  [name] feeling quite a bit better and is now back doing his normal workouts. ? ?July 14, 2012: ? ?Rodney Hester is doing well.  No CP or dyspnea.  He has had some plantar fascitis and has not been working out much.  He also has had a kidney stone removed and had hand surgery.    ? ?He has returned to work.  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 ? ?Jan. 5, 2014: ? ?He has retired (again).  He started back working and was not able to work out and gained weight.    No CP or dyspnea.   ? ?Sept. 29, 2015: ? ?Rodney Hester has had a cold and cough for the past 3 months.  Is taking Afrin.  BP is a bit high.    ?Still battling plantar fascitis.   ? ?He has started working again. ( 3rd career)    He has already retired twice. ?  ?Feb. 25, 2015 ? ?Rodney Hester is a 81 y.o. male who presents for follow-up of his coronary artery disease. ?[name] is doing well. ?Had nasal surgery Feb. 9.   ? ?Had some chest pain after doing some pushs recently.    He thinks it was a muscular  issue.   ?Has had plantas fascitis.  He thinks it was due to his old orthotics.  His foot pain is better.  ? ?Dec. 2, 2016:   ? ?Rodney Hester is doing ok from a cardiac standpoint ?Has had achilles tendonitis.  ? ?June, 1, 2017: ? ?Doing well from a cardiac standpoint.   Still has achilles tendonitis . ?Very slow getting better .  ?BP looks good ?No CP or dyspnea  ? ?Dec. 4, 2017:    ?Rodney Hester is seen today for follow up visit  ?Is doing much better.   Has been treating achilies tendonitits for the past 15 months .   Doing well.   ? ?Aug. 27, 2018: ? ?Rodney Hester is seen today  ?Still getting over left shoulder surgery.   ?Is back exercising  ?Walks 4 miles a day , 6 days a week.  ? ?March 16, 2017: ?Rodney Hester is doing well.  ?Still active. ?Having right knee issues , twisted his knee  ?No angina  ? ?March 16, 2018: ?Seen today for follow-up of his coronary artery disease.  He also has hypertension and hyperlipidemia. ? ?BP is up today ( he received some bad news  this am )  ?BP has been well controlled.  ?Walks regularly  ? ?Recent labs from Dr. Delene Ruffini office reveal a total cholesterol of 122.  The HDL is 49.  The LDL is 60.  The triglyceride level 61. ? ?Feb. 26, 2021 ? ?Doing very well.  Walking.  Working out with [employer].  Push ups  ?Has been on a healthy diet  ?Weight has been stable  ?No CP , dyspnea, syncope, presyncope  ? ?He saw Dr. Laurann Montana in January. ?Labs from that visit reveal a total cholesterol of 133.  HDL is 48.  LDL 69.  Triglycerides are 81.  Hemoglobin A1c is 6.2.  Potassium is 4.5. ? ? ?Feb. 28, 2022: ?Rodney Hester is seen for his CAD, HLD  ?BP is normal at home.  Slightly elevated here at the office.    ?Lost his brother and sister last year ?Not as active this year due to working through the estate of his brother .  ?Is getting back into the gym.  No angina while working out  ? ?Labs from Dr. Laurann Montana show  ?Chol = 150 ?HDL = 53 ?LDL = 80 ?Trig = 89 ? ?April 21, 2021 ?Rodney Hester is seen today for  follow up of his CAD, HLD  ?Had a dental implant last year that did not work  ?Has joined [employer] . Exercises regularly  ?Complains of having wrinkled skin on his arms - he thought is was due to rosuvastatin - I suspect it is an aging issue . ? ?Past Medical History:  ?Diagnosis Date  ? Coronary artery disease CARDIOLOGIST- DR Acie Fredrickson  ? MI 8/99  (09-08-2011  PT DENIES S & S)  ? History of acute inferior wall myocardial infarction 1999-  S/P STENT RCA  ? Hyperlipidemia   ? Hypertension   ? Left ureteral calculus   ? S/P primary angioplasty with coronary stent   ? ? ?Past Surgical History:  ?Procedure Laterality Date  ? ANTERIOR CRUCIATE LIGAMENT REPAIR  1970'S  ? LEFT KNEE  ? CARDIOVASCULAR STRESS TEST  2011  DR Tiernan Suto  ? PER PT NORMAL  ? CORONARY ANGIOPLASTY  [place]  ? FIRST DIAGONAL BRANCH  ? CORONARY ANGIOPLASTY WITH STENT PLACEMENT  AUG 1999  ? STENTING RCA  ? CORONARY ANGIOPLASTY WITH STENT PLACEMENT  MAR 2000  ? STENTING FIRST DIAGONAL FOR RESTENOSIS  AND PCI OF LAD  ? CYSTOSCOPY/RETROGRADE/URETEROSCOPY/STONE EXTRACTION WITH BASKET  09/11/2011  ? Procedure: CYSTOSCOPY/RETROGRADE/URETEROSCOPY/STONE EXTRACTION WITH BASKET;  Surgeon: Claybon Jabs, MD;  Location: [facility];  Service: Urology;  Laterality: Left;  ? EXTRACORPOREAL SHOCK WAVE LITHOTRIPSY  08-03-2011  ? LEFT  ? PULLEY RELEASE RIGHT RING FINGER/ LIMITED SYNOVECTOMY  12-26-2009  ? TRIGGER FINGER RELEASE  06/02/2011-  LOCAL ONLY  ? Procedure: MINOR RELEASE TRIGGER FINGER/A-1 PULLEY;  Surgeon: Cammie Sickle., MD;  Location: [place];  Service: Orthopedics;  Laterality: Left;  left ring  ? ? ? ?Current Outpatient Medications  ?Medication Sig Dispense Refill  ? aspirin 81 MG tablet Take 81 mg by mouth daily.    ? atorvastatin (LIPITOR) 40 MG tablet TAKE ONE TABLET BY MOUTH ONE TIME DAILY IN THE P.M. 90 tablet 3  ? metoprolol tartrate (LOPRESSOR) 50 MG tablet TAKE 1/2 TABLET BY MOUTH TWICE A DAY 90 tablet 3  ?  Multiple Vitamin (MULTIVITAMIN) tablet Take 1 tablet by mouth daily.    ? nitroGLYCERIN (NITROSTAT) 0.4 MG SL tablet Place 0.4 mg under the tongue  every 5 (five) minutes as needed.    ? telmisartan (MICARDIS) 80 MG tablet TAKE 1 TABLET BY MOUTH EVERY DAY 90 tablet 3  ? ?No current facility-administered medications for this visit.  ? ? ?Allergies:   Lisinopril and Zocor [simvastatin]  ? ? ?Social History:  The patient  reports that he has quit smoking. His smoking use included cigarettes. He has never used smokeless tobacco. He reports current alcohol use. He reports that he does not use drugs.  ? ?Family History:  The patient's family history includes Heart attack in his father.  ? ? ?ROS:  ?Noted in current history, all other systems are negative. ? ? ?Physical Exam: ?Blood pressure (!) 144/76, pulse 67, height 5\' 3"  (1.6 m), weight 153 lb 3.2 oz (69.5 kg), SpO2 98 %. ? ?GEN:  Well nourished, well developed in no acute distress ?HEENT: Normal ?NECK: No JVD; No carotid bruits ?LYMPHATICS: No lymphadenopathy ?CARDIAC: RRR , no murmurs, rubs, gallops ?RESPIRATORY:  Clear to auscultation without rales, wheezing or rhonchi  ?ABDOMEN: Soft, non-tender, non-distended ?MUSCULOSKELETAL:  No edema; No deformity  ?SKIN: Warm and dry ?NEUROLOGIC:  Alert and oriented x 3 ? ? ?EKG:     April 21, 2021: Normal sinus rhythm at 67.  No ST or T wave changes. ? ? ?Recent Labs: ?No results found for requested labs within last 8760 hours.  ? ? ?Lipid Panel ?   ?Component Value Date/Time  ? CHOL 124 03/16/2018 0950  ? TRIG 60 03/16/2018 0950  ? HDL 54 03/16/2018 0950  ? CHOLHDL 2.3 03/16/2018 0950  ? CHOLHDL 2.4 12/23/2015 1125  ? VLDL 14 12/23/2015 1125  ? [place] 58 03/16/2018 0950  ? ?  ? ?Wt Readings from Last 3 Encounters:  ?04/21/21 153 lb 3.2 oz (69.5 kg)  ?03/18/20 154 lb 3.2 oz (69.9 kg)  ?03/17/19 155 lb (70.3 kg)  ?  ? ? ?Other studies Reviewed: ?Additional studies/ records that were reviewed today include: . ?Review of the  above records demonstrates:  ? ? ?ASSESSMENT AND PLAN: ? ?1. CAD -    he exercises on a regular basis.  He is not having any episodes of angina. ? ?2. Hyperlipidemia -   ? ? ?3.  Carotid artery disease. -

## 2021-04-21 ENCOUNTER — Ambulatory Visit: Payer: BC Managed Care – PPO | Admitting: Cardiovascular Disease

## 2021-04-21 ENCOUNTER — Encounter: Payer: Self-pay | Admitting: Cardiovascular Disease

## 2021-04-21 VITALS — BP 144/76 | HR 67 | Ht 63.0 in | Wt 153.2 lb

## 2021-04-21 DIAGNOSIS — I2510 Atherosclerotic heart disease of native coronary artery without angina pectoris: Secondary | ICD-10-CM

## 2021-04-21 DIAGNOSIS — I779 Disorder of arteries and arterioles, unspecified: Secondary | ICD-10-CM | POA: Diagnosis not present

## 2021-04-21 DIAGNOSIS — E782 Mixed hyperlipidemia: Secondary | ICD-10-CM

## 2021-04-21 NOTE — Patient Instructions (Signed)
Medication Instructions:  ?Your physician recommends that you continue on your current medications as directed. Please refer to the Current Medication list given to you today. ? ?*If you need a refill on your cardiac medications before your next appointment, please call your pharmacy* ? ?Lab Work: ?NONE ?If you have labs (blood work) drawn today and your tests are completely normal, you will receive your results only by: ?MyChart Message (if you have MyChart) OR ?A paper copy in the mail ?If you have any lab test that is abnormal or we need to change your treatment, we will call you to review the results. ? ?Testing/Procedures: ?NONE ? ?Follow-Up: ?At [facility], you and your health needs are our priority.  As part of our continuing mission to provide you with exceptional heart care, we have created designated Provider Care Teams.  These Care Teams include your primary Cardiologist (physician) and Advanced Practice Providers (APPs -  Physician Assistants and Nurse Practitioners) who all work together to provide you with the care you need, when you need it. ? ?Your next appointment:   ?1 year(s) ? ?The format for your next appointment:   ?In Person ? ?Provider:   ?Vin Bhagat, PA-C or Scott Weaver, PA-C ?

## 2021-05-23 ENCOUNTER — Other Ambulatory Visit: Payer: Self-pay | Admitting: Cardiovascular Disease

## 2022-04-19 ENCOUNTER — Encounter: Payer: Self-pay | Admitting: Cardiovascular Disease

## 2022-04-19 NOTE — Progress Notes (Unsigned)
Cardiology Office Note   Date:  04/20/2022   ID:  Rodney Hester, DOB 1940/09/08, MRN [id number]  PCP:  Lavone Orn, MD  Cardiologist:   Mertie Moores, MD   Chief Complaint  Patient presents with   Coronary Artery Disease        Hypertension        1. CAD - s/p inferior wall myocardial infarction in 1999-status post stent placement to the right coronary artery. He is also status post stent placement to the first diagonal artery in March, 2000. At that time he had angioplasty to the LAD because of "snow plowing of plaque down the LAD from the 1st diagonal.  2. Hyperlipidemia 3. Kidney stone- left kidney,    82 y.o. year-old gentleman with a history of coronary artery disease. Status post myocardial infarction in 1998.  He has done very well. He has not had any episodes of angina. He works out on a daily basis.  He had a kidney stone this summer. He eventually required extraction of the stone by Dr. Leonidas Romberg.  [name] feeling quite a bit better and is now back doing his normal workouts.  July 14, 2012:  Rodney Hester is doing well.  No CP or dyspnea.  He has had some plantar fascitis and has not been working out much.  He also has had a kidney stone removed and had hand surgery.     He has returned to work.  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  Jan. 5, 2014:  He has retired (again).  He started back working and was not able to work out and gained weight.    No CP or dyspnea.    Sept. 29, 2015:  Rodney Hester has had a cold and cough for the past 3 months.  Is taking Afrin.  BP is a bit high.    Still battling plantar fascitis.    He has started working again. ( 3rd career)    He has already retired twice.   Feb. 25, 2015  Rodney Hester is a 82 y.o. male who presents for follow-up of his coronary artery disease. Rodney Hester is doing well. Had nasal surgery Feb. 9.    Had some chest pain after doing some pushs recently.    He thinks it was a muscular  issue.   Has had plantas fascitis.  He thinks it was due to his old orthotics.  His foot pain is better.   Dec. 2, 2016:    Rodney Hester is doing ok from a cardiac standpoint Has had achilles tendonitis.   June, 1, 2017:  Doing well from a cardiac standpoint.   Still has achilles tendonitis . Very slow getting better .  BP looks good No CP or dyspnea   Dec. 4, 2017:    Rodney Hester is seen today for follow up visit  Is doing much better.   Has been treating achilies tendonitits for the past 15 months .   Doing well.    Aug. 27, 2018:  Rodney Hester is seen today  Still getting over left shoulder surgery.   Is back exercising  Walks 4 miles a day , 6 days a week.   March 16, 2017: Rodney Hester is doing well.  Still active. Having right knee issues , twisted his knee  No angina   March 16, 2018: Seen today for follow-up of his coronary artery disease.  He also has hypertension and hyperlipidemia.  BP is up today ( he received some bad news  this am )  BP has been well controlled.  Walks regularly   Recent labs from Dr. Delene Ruffini office reveal a total cholesterol of 122.  The HDL is 49.  The LDL is 60.  The triglyceride level 61.  Feb. 26, 2021  Doing very well.  Walking.  Working out with [employer].  Push ups  Has been on a healthy diet  Weight has been stable  No CP , dyspnea, syncope, presyncope   He saw Dr. Laurann Montana in January. Labs from that visit reveal a total cholesterol of 133.  HDL is 48.  LDL 69.  Triglycerides are 81.  Hemoglobin A1c is 6.2.  Potassium is 4.5.   Feb. 28, 2022: Rodney Hester is seen for his CAD, HLD  BP is normal at home.  Slightly elevated here at the office.    Lost his brother and sister last year Not as active this year due to working through the estate of his brother .  Is getting back into the gym.  No angina while working out   Labs from Dr. Laurann Montana show  Chol = 150 HDL = 53 LDL = 80 Trig = 89  April 21, 2021 Rodney Hester is seen today for  follow up of his CAD, HLD  Had a dental implant last year that did not work  Has joined [employer] . Exercises regularly  Complains of having wrinkled skin on his arms - he thought is was due to rosuvastatin - I suspect it is an aging issue .   April 20, 2022 Rodney Hester is seen for follow up of his CAD, HLD   Had a fracture in his left ankle ( stepped into a hole )  Wore a cast,  was not able to work out regularl  Is back at working out regularly no  Has had several COVID boosters        Past Medical History:  Diagnosis Date   Coronary artery disease CARDIOLOGIST- DR Acie Fredrickson   MI 8/99  (09-08-2011  PT DENIES S & S)   History of acute inferior wall myocardial infarction 1999-  S/P STENT RCA   Hyperlipidemia    Hypertension    Left ureteral calculus    S/P primary angioplasty with coronary stent     Past Surgical History:  Procedure Laterality Date   ANTERIOR CRUCIATE LIGAMENT REPAIR  1970'S   LEFT KNEE   CARDIOVASCULAR STRESS TEST  2011  DR Ravina Milner   PER PT NORMAL   CORONARY ANGIOPLASTY  NOV 1999   FIRST DIAGONAL BRANCH   CORONARY ANGIOPLASTY WITH STENT PLACEMENT  AUG 1999   STENTING RCA   CORONARY ANGIOPLASTY WITH STENT PLACEMENT  MAR 2000   STENTING FIRST DIAGONAL FOR RESTENOSIS  AND PCI OF LAD   CYSTOSCOPY/RETROGRADE/URETEROSCOPY/STONE EXTRACTION WITH BASKET  09/11/2011   Procedure: CYSTOSCOPY/RETROGRADE/URETEROSCOPY/STONE EXTRACTION WITH BASKET;  Surgeon: Claybon Jabs, MD;  Location: [facility];  Service: Urology;  Laterality: Left;   EXTRACORPOREAL SHOCK WAVE LITHOTRIPSY  08-03-2011   LEFT   PULLEY RELEASE RIGHT RING FINGER/ LIMITED SYNOVECTOMY  12-26-2009   TRIGGER FINGER RELEASE  06/02/2011-  LOCAL ONLY   Procedure: MINOR RELEASE TRIGGER FINGER/A-1 PULLEY;  Surgeon: Cammie Sickle., MD;  Location: [place];  Service: Orthopedics;  Laterality: Left;  left ring     Current Outpatient Medications  Medication Sig Dispense Refill    aspirin 81 MG tablet Take 81 mg by mouth daily.     atorvastatin (LIPITOR) 40 MG  tablet TAKE ONE TABLET BY MOUTH ONE TIME DAILY IN THE P.M. 90 tablet 3   metoprolol tartrate (LOPRESSOR) 50 MG tablet TAKE 1/2 TABLET BY MOUTH TWICE A DAY 90 tablet 3   Multiple Vitamin (MULTIVITAMIN) tablet Take 1 tablet by mouth daily.     nitroGLYCERIN (NITROSTAT) 0.4 MG SL tablet Place 0.4 mg under the tongue every 5 (five) minutes as needed.     telmisartan (MICARDIS) 80 MG tablet TAKE 1 TABLET BY MOUTH EVERY DAY 90 tablet 3   No current facility-administered medications for this visit.    Allergies:   Lisinopril and Zocor [simvastatin]    Social History:  The patient  reports that he has quit smoking. His smoking use included cigarettes. He has never used smokeless tobacco. He reports current alcohol use. He reports that he does not use drugs.   Family History:  The patient's family history includes Heart attack in his father.    ROS:  Noted in current history, all other systems are negative.   Physical Exam: Blood pressure 138/72, pulse 66, height 5\' 3"  (1.6 m), weight 153 lb 6.4 oz (69.6 kg), SpO2 97 %.       GEN:  Well nourished, well developed in no acute distress HEENT: Normal NECK: No JVD; No carotid bruits LYMPHATICS: No lymphadenopathy CARDIAC: RRR , no murmurs, rubs, gallops RESPIRATORY:  Clear to auscultation without rales, wheezing or rhonchi  ABDOMEN: Soft, non-tender, non-distended MUSCULOSKELETAL:  No edema; No deformity  SKIN: Warm and dry NEUROLOGIC:  Alert and oriented x 3   EKG:    April 20, 2022 NSR at 66.  Minimal voltage criteria for LVH,  previous INf. MI    Recent Labs: No results found for requested labs within last 365 days.    Lipid Panel    Component Value Date/Time   CHOL 124 03/16/2018 0950   TRIG 60 03/16/2018 0950   HDL 54 03/16/2018 0950   CHOLHDL 2.3 03/16/2018 0950   CHOLHDL 2.4 12/23/2015 1125   VLDL 14 12/23/2015 1125   LDLCALC 58  03/16/2018 0950      Wt Readings from Last 3 Encounters:  04/20/22 153 lb 6.4 oz (69.6 kg)  04/21/21 153 lb 3.2 oz (69.5 kg)  03/18/20 154 lb 3.2 oz (69.9 kg)      Other studies Reviewed: Additional studies/ records that were reviewed today include: . Review of the above records demonstrates:    ASSESSMENT AND PLAN:  1. CAD -     no angina   2. Hyperlipidemia -  LDL is 72.   Check lipids, ALT , BMP today    3.  Carotid artery disease. -    stable    Current medicines are reviewed at length with the patient today.  The patient does not have concerns regarding medicines.  The following changes have been made:  no change   Disposition:        Signed, Mertie Moores, MD  04/20/2022 9:44 AM    [facility] [address], [place], [place]  [address] Phone: [phone]; Fax: [phone]

## 2022-04-20 ENCOUNTER — Encounter: Payer: Self-pay | Admitting: Cardiovascular Disease

## 2022-04-20 ENCOUNTER — Ambulatory Visit: Payer: BC Managed Care – PPO | Attending: Cardiovascular Disease | Admitting: Cardiovascular Disease

## 2022-04-20 VITALS — BP 138/72 | HR 66 | Ht 63.0 in | Wt 153.4 lb

## 2022-04-20 DIAGNOSIS — I2510 Atherosclerotic heart disease of native coronary artery without angina pectoris: Secondary | ICD-10-CM

## 2022-04-20 DIAGNOSIS — E782 Mixed hyperlipidemia: Secondary | ICD-10-CM | POA: Diagnosis not present

## 2022-04-20 NOTE — Patient Instructions (Signed)
Medication Instructions:  Your physician recommends that you continue on your current medications as directed. Please refer to the Current Medication list given to you today.  *If you need a refill on your cardiac medications before your next appointment, please call your pharmacy*   Lab Work: Lipids, ALT, BMET today If you have labs (blood work) drawn today and your tests are completely normal, you will receive your results only by: MyChart Message (if you have MyChart) OR A paper copy in the mail If you have any lab test that is abnormal or we need to change your treatment, we will call you to review the results.   Testing/Procedures: NONE   Follow-Up: At [facility], you and your health needs are our priority.  As part of our continuing mission to provide you with exceptional heart care, we have created designated Provider Care Teams.  These Care Teams include your primary Cardiologist (physician) and Advanced Practice Providers (APPs -  Physician Assistants and Nurse Practitioners) who all work together to provide you with the care you need, when you need it.  We recommend signing up for the patient portal called "MyChart".  Sign up information is provided on this After Visit Summary.  MyChart is used to connect with patients for Virtual Visits (Telemedicine).  Patients are able to view lab/test results, encounter notes, upcoming appointments, etc.  Non-urgent messages can be sent to your provider as well.   To learn more about what you can do with MyChart, go to [email or web address].    Your next appointment:   1 year(s)  Provider:   Philip Nahser, MD      

## 2022-04-21 LAB — LIPID PANEL
Chol/HDL Ratio: 2.4 ratio (ref 0.0–5.0)
Cholesterol, Total: 146 mg/dL (ref 100–199)
HDL: 61 mg/dL (ref >39)
LDL Chol Calc (NIH): 71 mg/dL (ref 0–99)
Triglycerides: 68 mg/dL (ref 0–149)
VLDL Cholesterol Cal: 14 mg/dL (ref 5–40)

## 2022-04-21 LAB — BASIC METABOLIC PANEL
BUN/Creatinine Ratio: 15 (ref 10–24)
BUN: 14 mg/dL (ref 8–27)
CO2: 25 mmol/L (ref 20–29)
Calcium: 9.7 mg/dL (ref 8.6–10.2)
Chloride: 105 mmol/L (ref 96–106)
Creatinine, Ser: 0.94 mg/dL (ref 0.76–1.27)
Glucose: 128 mg/dL — ABNORMAL HIGH (ref 70–99)
Potassium: 5.3 mmol/L — ABNORMAL HIGH (ref 3.5–5.2)
Sodium: 143 mmol/L (ref 134–144)
eGFR: 82 mL/min/{1.73_m2} (ref >59)

## 2022-04-21 LAB — ALT: ALT: 30 IU/L (ref 0–44)

## 2022-05-17 ENCOUNTER — Other Ambulatory Visit: Payer: Self-pay | Admitting: Cardiovascular Disease

## 2023-01-04 ENCOUNTER — Other Ambulatory Visit (HOSPITAL_COMMUNITY): Payer: Self-pay | Admitting: Internal Medicine

## 2023-01-04 DIAGNOSIS — R1012 Left upper quadrant pain: Secondary | ICD-10-CM

## 2023-01-06 ENCOUNTER — Ambulatory Visit (HOSPITAL_COMMUNITY)
Admission: RE | Admit: 2023-01-06 | Discharge: 2023-01-06 | Disposition: A | Discharge status: Home / Self Care | Payer: BC Managed Care – PPO | Source: Ambulatory Visit | Attending: Internal Medicine | Admitting: Internal Medicine

## 2023-01-06 DIAGNOSIS — R1012 Left upper quadrant pain: Secondary | ICD-10-CM | POA: Insufficient documentation

## 2023-01-06 MED ORDER — IOHEXOL 300 MG/ML  SOLN
100.0000 mL | Freq: Once | INTRAMUSCULAR | Status: AC | PRN
  Administered 2023-01-06: 100 mL via INTRAVENOUS

## 2023-01-06 MED ORDER — SODIUM CHLORIDE (PF) 0.9 % IJ SOLN
INTRAMUSCULAR | Status: AC
  Filled 2023-01-06: qty 50

## 2023-03-22 ENCOUNTER — Telehealth: Payer: Self-pay | Admitting: Cardiovascular Disease

## 2023-03-22 NOTE — Telephone Encounter (Signed)
   Name: Rodney Hester  DOB: 02-11-1940  MRN: [id number]  Primary Cardiologist: Kristeen Miss, MD   Preoperative team, please contact this patient and set up a phone call appointment for further preoperative risk assessment. Please obtain consent and complete medication review. Thank you for your help.  I confirm that guidance regarding antiplatelet and oral anticoagulation therapy has been completed and, if necessary, noted below.  Pt should continue aspirin throughout the perioperative period.   I also confirmed the patient resides in the state of [place]. As per [facility] Medical Board telemedicine laws, the patient must reside in the state in which the provider is licensed.   Joylene Grapes, NP 03/22/2023, 11:18 AM [facility]

## 2023-03-22 NOTE — Telephone Encounter (Signed)
   Pre-operative Risk Assessment    Patient Name: Rodney Hester  DOB: 1940/02/18 MRN: [id number]   Date of last office visit:  Date of next office visit:    Request for Surgical Clearance    Procedure:   A left long finger trigger release  Date of Surgery:  Clearance   04-07-23                              Surgeon:  Dr Mack Hook Surgeon's Group or Practice Name:   Phone number:  [phone] Fax number:  [phone]   Type of Clearance Requested:  Medicine and Medical    Type of Anesthesia:  MAC   Additional requests/questions:    Signed, Laurence Ferrari   03/22/2023, 11:09 AM

## 2023-03-22 NOTE — Telephone Encounter (Signed)
 Left message to call back to schedule tele pre op appt.

## 2023-03-23 ENCOUNTER — Telehealth: Payer: Self-pay | Admitting: *Deleted

## 2023-03-23 NOTE — Telephone Encounter (Signed)
 Pt has been scheduled tele preop appt 05/03/23. Med rec and consent are done.

## 2023-03-23 NOTE — Telephone Encounter (Signed)
 Pt has been scheduled tele preop appt 05/03/23. Med rec and consent are done.     Patient Consent for Virtual Visit        Rodney Hester has provided verbal consent on 03/23/2023 for a virtual visit (video or telephone).   CONSENT FOR VIRTUAL VISIT FOR:  Rodney Hester  By participating in this virtual visit I agree to the following:  I hereby voluntarily request, consent and authorize [facility] and its employed or contracted physicians, physician assistants, nurse practitioners or other licensed health care professionals (the Practitioner), to provide me with telemedicine health care services (the "Services") as deemed necessary by the treating Practitioner. I acknowledge and consent to receive the Services by the Practitioner via telemedicine. I understand that the telemedicine visit will involve communicating with the Practitioner through live audiovisual communication technology and the disclosure of certain medical information by electronic transmission. I acknowledge that I have been given the opportunity to request an in-person assessment or other available alternative prior to the telemedicine visit and am voluntarily participating in the telemedicine visit.  I understand that I have the right to withhold or withdraw my consent to the use of telemedicine in the course of my care at any time, without affecting my right to future care or treatment, and that the Practitioner or I may terminate the telemedicine visit at any time. I understand that I have the right to inspect all information obtained and/or recorded in the course of the telemedicine visit and may receive copies of available information for a reasonable fee.  I understand that some of the potential risks of receiving the Services via telemedicine include:  Delay or interruption in medical evaluation due to technological equipment failure or disruption; Information transmitted may not be sufficient (e.g. poor  resolution of images) to allow for appropriate medical decision making by the Practitioner; and/or  In rare instances, security protocols could fail, causing a breach of personal health information.  Furthermore, I acknowledge that it is my responsibility to provide information about my medical history, conditions and care that is complete and accurate to the best of my ability. I acknowledge that Practitioner's advice, recommendations, and/or decision may be based on factors not within their control, such as incomplete or inaccurate data provided by me or distortions of diagnostic images or specimens that may result from electronic transmissions. I understand that the practice of medicine is not an exact science and that Practitioner makes no warranties or guarantees regarding treatment outcomes. I acknowledge that a copy of this consent can be made available to me via my patient portal [facility] MyChart), or I can request a printed copy by calling the office of [facility].    I understand that my insurance will be billed for this visit.   I have read or had this consent read to me. I understand the contents of this consent, which adequately explains the benefits and risks of the Services being provided via telemedicine.  I have been provided ample opportunity to ask questions regarding this consent and the Services and have had my questions answered to my satisfaction. I give my informed consent for the services to be provided through the use of telemedicine in my medical care

## 2023-04-02 ENCOUNTER — Ambulatory Visit: Payer: Self-pay | Attending: Cardiovascular Disease

## 2023-04-02 DIAGNOSIS — Z01810 Encounter for preprocedural cardiovascular examination: Secondary | ICD-10-CM

## 2023-04-02 NOTE — Progress Notes (Signed)
 Virtual Visit via Telephone Note   Because of Jeanette Moffatt co-morbid illnesses, he is at least at moderate risk for complications without adequate follow up.  This format is felt to be most appropriate for this patient at this time.  Due to technical limitations with video connection (technology), today's appointment will be conducted as an audio only telehealth visit, and Kairi Tufo verbally agreed to proceed in this manner.   All issues noted in this document were discussed and addressed.  No physical exam could be performed with this format.  Evaluation Performed:  Preoperative cardiovascular risk assessment _____________   Date:  04/02/2023   Patient ID:  Rodney Hester, DOB February 07, 1940, MRN [id number] Patient Location:  Home Provider location:   Office  Primary Care Provider:  Kirby Funk, MD (Inactive) Primary Cardiologist:  Kristeen Miss, MD  Chief Complaint / Patient Profile   83 y.o. y/o male with a h/o coronary artery disease, hypertension, hyperlipidemia who is pending trigger finger release and presents today for telephonic preoperative cardiovascular risk assessment.  History of Present Illness    Rodney Hester is a 83 y.o. male who presents via audio/video conferencing for a telehealth visit today.  Pt was last seen in cardiology clinic on 04/20/2022 by Dr. Elease Hashimoto.  At that time Eliyas Suddreth was doing well .  The patient is now pending procedure as outlined above. Since his last visit, he continues to be stable from a cardiac standpoint.  Today he denies chest pain, shortness of breath, lower extremity edema, fatigue, palpitations, melena, hematuria, hemoptysis, diaphoresis, weakness, presyncope, syncope, orthopnea, and PND.   Past Medical History    Past Medical History:  Diagnosis Date   Coronary artery disease CARDIOLOGIST- DR Elease Hashimoto   MI 8/99  (09-08-2011  PT DENIES S & S)   History of acute inferior wall myocardial infarction 1999-  S/P STENT  RCA   Hyperlipidemia    Hypertension    Left ureteral calculus    S/P primary angioplasty with coronary stent    Past Surgical History:  Procedure Laterality Date   ANTERIOR CRUCIATE LIGAMENT REPAIR  1970'S   LEFT KNEE   CARDIOVASCULAR STRESS TEST  2011  DR NAHSER   PER PT NORMAL   CORONARY ANGIOPLASTY  NOV 1999   FIRST DIAGONAL BRANCH   CORONARY ANGIOPLASTY WITH STENT PLACEMENT  AUG 1999   STENTING RCA   CORONARY ANGIOPLASTY WITH STENT PLACEMENT  MAR 2000   STENTING FIRST DIAGONAL FOR RESTENOSIS  AND PCI OF LAD   CYSTOSCOPY/RETROGRADE/URETEROSCOPY/STONE EXTRACTION WITH BASKET  09/11/2011   Procedure: CYSTOSCOPY/RETROGRADE/URETEROSCOPY/STONE EXTRACTION WITH BASKET;  Surgeon: Garnett Farm, MD;  Location: [facility];  Service: Urology;  Laterality: Left;   EXTRACORPOREAL SHOCK WAVE LITHOTRIPSY  08-03-2011   LEFT   PULLEY RELEASE RIGHT RING FINGER/ LIMITED SYNOVECTOMY  12-26-2009   TRIGGER FINGER RELEASE  06/02/2011-  LOCAL ONLY   Procedure: MINOR RELEASE TRIGGER FINGER/A-1 PULLEY;  Surgeon: Wyn Forster., MD;  Location: [facility];  Service: Orthopedics;  Laterality: Left;  left ring    Allergies  Allergies  Allergen Reactions   Lisinopril Hives and Other (See Comments)    WHEN THIS MED IS TAKEN W/ ZOCOR CAUSES HIVES   Zocor [Simvastatin] Other (See Comments)    WHEN COMBINED W/ LISINOPRIL CAUSES HIVES    Home Medications    Prior to Admission medications   Medication Sig Start Date End Date Taking? Authorizing Provider  aspirin 81 MG tablet Take 81 mg  by mouth daily.    [provider]  atorvastatin (LIPITOR) 40 MG tablet TAKE 1 TABLET BY MOUTH EVERY DAY IN THE EVENING 05/18/22   Nahser, Deloris Ping, MD  metoprolol tartrate (LOPRESSOR) 50 MG tablet TAKE 1/2 TABLET BY MOUTH TWICE A DAY 05/18/22   Nahser, Deloris Ping, MD  Multiple Vitamin (MULTIVITAMIN) tablet Take 1 tablet by mouth daily.    [provider]  nitroGLYCERIN  (NITROSTAT) 0.4 MG SL tablet Place 0.4 mg under the tongue every 5 (five) minutes as needed.    [provider]  telmisartan (MICARDIS) 80 MG tablet TAKE 1 TABLET BY MOUTH EVERY DAY 05/18/22   Nahser, Deloris Ping, MD    Physical Exam    Vital Signs:  [name] does not have vital signs available for review today.  Given telephonic nature of communication, physical exam is limited. AAOx3. NAD. Normal affect.  Speech and respirations are unlabored.  Accessory Clinical Findings    None  Assessment & Plan    1.  Preoperative Cardiovascular Risk Assessment:Procedure:   A left long finger trigger release   Date of Surgery:  Clearance   04-07-23                                Surgeon:  Dr Mack Hook Surgeon's Group or Practice Name:   Phone number:  [phone] Fax number:  [phone]      Primary Cardiologist: Kristeen Miss, MD  Chart reviewed as part of pre-operative protocol coverage. Given past medical history and time since last visit, based on ACC/AHA guidelines, Antwine Agosto would be at acceptable risk for the planned procedure without further cardiovascular testing.   His RCRI is low risk, 0.9% risk of major cardiac event.  He is able to complete greater than 4 METS of physical activity.  Patient was advised that if he develops new symptoms prior to surgery to contact our office to arrange a follow-up appointment.  He verbalized understanding.  Pt should continue aspirin throughout the perioperative period.   I will route this recommendation to the requesting party via Epic fax function and remove from pre-op pool.      Time:   Today, I have spent 5 minutes with the patient with telehealth technology discussing medical history, symptoms, and management plan.  I spent 10 minutes reviewing past medical history, cardiac medications, and cardiac tests   Ronney Asters, NP  04/02/2023, 7:18 AM

## 2023-05-15 ENCOUNTER — Other Ambulatory Visit: Payer: Self-pay | Admitting: Cardiovascular Disease

## 2023-05-23 ENCOUNTER — Other Ambulatory Visit: Payer: Self-pay | Admitting: Cardiovascular Disease

## 2023-05-27 ENCOUNTER — Other Ambulatory Visit: Payer: Self-pay | Admitting: Cardiovascular Disease

## 2023-06-13 ENCOUNTER — Encounter: Payer: Self-pay | Admitting: Cardiovascular Disease

## 2023-06-14 ENCOUNTER — Other Ambulatory Visit: Payer: Self-pay | Admitting: Cardiovascular Disease

## 2023-06-18 MED ORDER — TELMISARTAN 80 MG PO TABS: 80.0000 mg | ORAL_TABLET | Freq: Every day | ORAL | 0 refills | Status: DC

## 2023-06-20 ENCOUNTER — Encounter: Payer: Self-pay | Admitting: Cardiovascular Disease

## 2023-06-20 NOTE — Progress Notes (Unsigned)
 Cardiology Office Note   Date:  06/21/2023   ID:  Rodney Hester, DOB 1940/01/22, MRN [id number]  PCP:  Lysle Saunas, MD (Inactive)  Cardiologist:   Ahmad Alert, MD   Chief Complaint  Patient presents with   Coronary Artery Disease        Hyperlipidemia   1. CAD - s/p inferior wall myocardial infarction in 1999-status post stent placement to the right coronary artery. He is also status post stent placement to the first diagonal artery in March, 2000. At that time he had angioplasty to the LAD because of "snow plowing of plaque down the LAD from the 1st diagonal.  2. Hyperlipidemia 3. Kidney stone- left kidney,    83 y.o. year-old gentleman with a history of coronary artery disease. Status post myocardial infarction in 1998.  He has done very well. He has not had any episodes of angina. He works out on a daily basis.  He had a kidney stone this summer. He eventually required extraction of the stone by Dr. Mark Ottlin.  [name] feeling quite a bit better and is now back doing his normal workouts.  July 14, 2012:  Rodney Hester is doing well.  No CP or dyspnea.  He has had some plantar fascitis and has not been working out much.  He also has had a kidney stone removed and had hand surgery.     He has returned to work.  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  Jan. 5, 2014:  He has retired (again).  He started back working and was not able to work out and gained weight.    No CP or dyspnea.    Sept. 29, 2015:  Rodney Hester has had a cold and cough for the past 3 months.  Is taking Afrin.  BP is a bit high.    Still battling plantar fascitis.    He has started working again. ( 3rd career)    He has already retired twice.   Feb. 25, 2015  Rodney Hester is a 83 y.o. male who presents for follow-up of his coronary artery disease. Rodney Hester is doing well. Had nasal surgery Feb. 9.    Had some chest pain after doing some pushs recently.    He thinks it was a  muscular issue.   Has had plantas fascitis.  He thinks it was due to his old orthotics.  His foot pain is better.   Dec. 2, 2016:    Rodney Hester is doing ok from a cardiac standpoint Has had achilles tendonitis.   June, 1, 2017:  Doing well from a cardiac standpoint.   Still has achilles tendonitis . Very slow getting better .  BP looks good No CP or dyspnea   Dec. 4, 2017:    Rodney Hester is seen today for follow up visit  Is doing much better.   Has been treating achilies tendonitits for the past 15 months .   Doing well.    Aug. 27, 2018:  Rodney Hester is seen today  Still getting over left shoulder surgery.   Is back exercising  Walks 4 miles a day , 6 days a week.   March 16, 2017: Rodney Hester is doing well.  Still active. Having right knee issues , twisted his knee  No angina   March 16, 2018: Seen today for follow-up of his coronary artery disease.  He also has hypertension and hyperlipidemia.  BP is up today ( he received some bad news this am )  BP has been well controlled.  Walks regularly   Recent labs from Dr. [employer] office reveal a total cholesterol of 122.  The HDL is 49.  The LDL is 60.  The triglyceride level 61.  Feb. 26, 2021  Doing very well.  Walking.  Working out with [employer].  Push ups  Has been on a healthy diet  Weight has been stable  No CP , dyspnea, syncope, presyncope   He saw Dr. Manfred Seed in January. Labs from that visit reveal a total cholesterol of 133.  HDL is 48.  LDL 69.  Triglycerides are 81.  Hemoglobin A1c is 6.2.  Potassium is 4.5.   Feb. 28, 2022: Rodney Hester is seen for his CAD, HLD  BP is normal at home.  Slightly elevated here at the office.    Lost his brother and sister last year Not as active this year due to working through the estate of his brother .  Is getting back into the gym.  No angina while working out   Labs from Dr. Manfred Seed show  Chol = 150 HDL = 53 LDL = 80 Trig = 89  April 21, 2021 Rodney Hester is seen  today for follow up of his CAD, HLD  Had a dental implant last year that did not work  Has joined [employer] . Exercises regularly  Complains of having wrinkled skin on his arms - he thought is was due to rosuvastatin - I suspect it is an aging issue .   April 20, 2022 Rodney Hester is seen for follow up of his CAD, HLD   Had a fracture in his left ankle ( stepped into a hole )  Wore a cast,  was not able to work out regularl  Is back at working out regularly no  Has had several COVID boosters   June 21, 2023 Rodney Hester is seen for follow up of his CAD, HLD HTN  No CP Remains active  No CP , no dyspnea     Past Medical History:  Diagnosis Date   Coronary artery disease CARDIOLOGIST- DR Alroy Aspen   MI 8/99  (09-08-2011  PT DENIES S & S)   History of acute inferior wall myocardial infarction 1999-  S/P STENT RCA   Hyperlipidemia    Hypertension    Left ureteral calculus    S/P primary angioplasty with coronary stent     Past Surgical History:  Procedure Laterality Date   ANTERIOR CRUCIATE LIGAMENT REPAIR  1970'S   LEFT KNEE   CARDIOVASCULAR STRESS TEST  2011  DR Anas Reister   PER PT NORMAL   CORONARY ANGIOPLASTY  NOV 1999   FIRST DIAGONAL BRANCH   CORONARY ANGIOPLASTY WITH STENT PLACEMENT  AUG 1999   STENTING RCA   CORONARY ANGIOPLASTY WITH STENT PLACEMENT  MAR 2000   STENTING FIRST DIAGONAL FOR RESTENOSIS  AND PCI OF LAD   CYSTOSCOPY/RETROGRADE/URETEROSCOPY/STONE EXTRACTION WITH BASKET  09/11/2011   Procedure: CYSTOSCOPY/RETROGRADE/URETEROSCOPY/STONE EXTRACTION WITH BASKET;  Surgeon: Mark C Ottelin, MD;  Location: [facility];  Service: Urology;  Laterality: Left;   EXTRACORPOREAL SHOCK WAVE LITHOTRIPSY  08-03-2011   LEFT   PULLEY RELEASE RIGHT RING FINGER/ LIMITED SYNOVECTOMY  12-26-2009   TRIGGER FINGER RELEASE  06/02/2011-  LOCAL ONLY   Procedure: MINOR RELEASE TRIGGER FINGER/A-1 PULLEY;  Surgeon: Amelie Baize., MD;  Location: [facility];  Service:  Orthopedics;  Laterality: Left;  left ring     Current Outpatient Medications  Medication Sig Dispense Refill  aspirin 81 MG tablet Take 81 mg by mouth daily.     atorvastatin  (LIPITOR) 40 MG tablet TAKE 1 TABLET BY MOUTH EVERY DAY IN THE EVENING 30 tablet 0   metoprolol  tartrate (LOPRESSOR ) 50 MG tablet TAKE 0.5 TABLETS BY MOUTH 2 TIMES DAILY. 30 tablet 0   Multiple Vitamin (MULTIVITAMIN) tablet Take 1 tablet by mouth daily.     nitroGLYCERIN (NITROSTAT) 0.4 MG SL tablet Place 0.4 mg under the tongue every 5 (five) minutes as needed.     telmisartan  (MICARDIS ) 80 MG tablet Take 1 tablet (80 mg total) by mouth daily. 30 tablet 0   No current facility-administered medications for this visit.    Allergies:   Lisinopril and Zocor [simvastatin]    Social History:  The patient  reports that he has quit smoking. His smoking use included cigarettes. He has never used smokeless tobacco. He reports current alcohol use. He reports that he does not use drugs.   Family History:  The patient's family history includes Heart attack in his father.    ROS:  Noted in current history, all other systems are negative.   Physical Exam: Blood pressure 130/70, pulse 69, height 5\' 3"  (1.6 m), weight 146 lb (66.2 kg), SpO2 97%.       GEN:  Well nourished, well developed in no acute distress HEENT: Normal NECK: No JVD; No carotid bruits LYMPHATICS: No lymphadenopathy CARDIAC: RRR , no murmurs, rubs, gallops RESPIRATORY:  Clear to auscultation without rales, wheezing or rhonchi  ABDOMEN: Soft, non-tender, non-distended MUSCULOSKELETAL:  No edema; No deformity  SKIN: Warm and dry NEUROLOGIC:  Alert and oriented x 3    EKG:   EKG Interpretation Date/Time:  Monday June 21 2023 12:01:11 EDT Ventricular Rate:  69 PR Interval:  154 QRS Duration:  82 QT Interval:  400 QTC Calculation: 428 R Axis:   44  Text Interpretation: Normal sinus rhythm Normal ECG When compared with ECG of 31-Jul-2011  11:45, No significant change was found Confirmed by Ahmad Alert ([id number]) on 06/21/2023 12:21:17 PM      Recent Labs: No results found for requested labs within last 365 days.    Lipid Panel    Component Value Date/Time   CHOL 146 04/20/2022 0945   TRIG 68 04/20/2022 0945   HDL 61 04/20/2022 0945   CHOLHDL 2.4 04/20/2022 0945   CHOLHDL 2.4 12/23/2015 1125   VLDL 14 12/23/2015 1125   LDLCALC 71 04/20/2022 0945      Wt Readings from Last 3 Encounters:  06/21/23 146 lb (66.2 kg)  04/20/22 153 lb 6.4 oz (69.6 kg)  04/21/21 153 lb 3.2 oz (69.5 kg)      Other studies Reviewed: Additional studies/ records that were reviewed today include: . Review of the above records demonstrates:    ASSESSMENT AND PLAN:  1. CAD -    no CP , no dyspnea   2. Hyperlipidemia -   lipids have been stable ,  continue current meds.  Check lipids , ALT, BMP today    3.  Carotid artery disease. -    stable     Current medicines are reviewed at length with the patient today.  The patient does not have concerns regarding medicines.  The following changes have been made:  no change   Disposition:        Signed, Ahmad Alert, MD  06/21/2023 12:21 PM    [facility] [address] [place], [place], [place]  [address] Phone: [phone])  [phone]; Fax: [phone]

## 2023-06-21 ENCOUNTER — Ambulatory Visit: Payer: Self-pay | Attending: Cardiovascular Disease | Admitting: Cardiovascular Disease

## 2023-06-21 VITALS — BP 130/70 | HR 69 | Ht 63.0 in | Wt 146.0 lb

## 2023-06-21 DIAGNOSIS — E782 Mixed hyperlipidemia: Secondary | ICD-10-CM | POA: Diagnosis not present

## 2023-06-21 DIAGNOSIS — I2510 Atherosclerotic heart disease of native coronary artery without angina pectoris: Secondary | ICD-10-CM

## 2023-06-21 NOTE — Patient Instructions (Signed)
  Lab Work: Lipids, ALT, BMET today If you have labs (blood work) drawn today and your tests are completely normal, you will receive your results only by: MyChart Message (if you have MyChart) OR A paper copy in the mail If you have any lab test that is abnormal or we need to change your treatment, we will call you to review the results.  Follow-Up: At [facility], you and your health needs are our priority.  As part of our continuing mission to provide you with exceptional heart care, our providers are all part of one team.  This team includes your primary Cardiologist (physician) and Advanced Practice Providers or APPs (Physician Assistants and Nurse Practitioners) who all work together to provide you with the care you need, when you need it.  Your next appointment:   1 year(s)  Provider:   Ahmad Alert, MD

## 2023-06-22 LAB — BASIC METABOLIC PANEL WITH GFR
BUN/Creatinine Ratio: 17 (ref 10–24)
BUN: 14 mg/dL (ref 8–27)
CO2: 25 mmol/L (ref 20–29)
Calcium: 9.9 mg/dL (ref 8.6–10.2)
Chloride: 104 mmol/L (ref 96–106)
Creatinine, Ser: 0.81 mg/dL (ref 0.76–1.27)
Glucose: 130 mg/dL — ABNORMAL HIGH (ref 70–99)
Potassium: 5.3 mmol/L — ABNORMAL HIGH (ref 3.5–5.2)
Sodium: 144 mmol/L (ref 134–144)
eGFR: 89 mL/min/{1.73_m2} (ref >59)

## 2023-06-22 LAB — ALT: ALT: 39 IU/L (ref 0–44)

## 2023-06-22 LAB — LIPID PANEL
Chol/HDL Ratio: 2.6 ratio (ref 0.0–5.0)
Cholesterol, Total: 177 mg/dL (ref 100–199)
HDL: 67 mg/dL (ref >39)
LDL Chol Calc (NIH): 93 mg/dL (ref 0–99)
Triglycerides: 91 mg/dL (ref 0–149)
VLDL Cholesterol Cal: 17 mg/dL (ref 5–40)

## 2023-06-23 ENCOUNTER — Ambulatory Visit: Payer: Self-pay | Admitting: *Deleted

## 2023-06-23 DIAGNOSIS — E871 Hypo-osmolality and hyponatremia: Secondary | ICD-10-CM

## 2023-06-23 DIAGNOSIS — E875 Hyperkalemia: Secondary | ICD-10-CM

## 2023-07-06 NOTE — Telephone Encounter (Signed)
 Pt has viewed results and physician's recommendations via MyChart. BMET order placed and released at this time. Pt made aware of lab protocol via MyChart.

## 2023-07-06 NOTE — Telephone Encounter (Signed)
-----   Message from Ahmad Alert sent at 06/25/2023  6:38 AM EDT ----- Sodium is chronically low Potassium is very mildly elevated  If he is eating lots of food that is high in potassium, please have him reduce the intake of these foods   Recheck BMP in 1 month

## 2023-07-11 ENCOUNTER — Other Ambulatory Visit: Payer: Self-pay | Admitting: Cardiovascular Disease

## 2023-08-05 ENCOUNTER — Telehealth: Payer: Self-pay | Admitting: *Deleted

## 2023-08-05 DIAGNOSIS — E875 Hyperkalemia: Secondary | ICD-10-CM

## 2023-08-05 NOTE — Telephone Encounter (Signed)
 Rodney JINNY Passe, MD 06/25/2023  6:38 AM EDT Back to Top    Sodium is chronically low Potassium is very mildly elevated If he is eating lots of food that is high in potassium, please have him reduce the intake of these foods  Recheck BMP in 1 month     Patient's in [facility] to recheck BMP  a month later as requested by Dr Hester.  Dr Hester is no longer practicing. RN placed  order under D.O.D of the day - Dr Michele.

## 2023-08-06 ENCOUNTER — Ambulatory Visit: Payer: Self-pay | Admitting: *Deleted

## 2023-08-06 LAB — BASIC METABOLIC PANEL WITH GFR
BUN/Creatinine Ratio: 13 (ref 10–24)
BUN: 10 mg/dL (ref 8–27)
CO2: 24 mmol/L (ref 20–29)
Calcium: 9.5 mg/dL (ref 8.6–10.2)
Chloride: 103 mmol/L (ref 96–106)
Creatinine, Ser: 0.8 mg/dL (ref 0.76–1.27)
Glucose: 147 mg/dL — ABNORMAL HIGH (ref 70–99)
Potassium: 4.7 mmol/L (ref 3.5–5.2)
Sodium: 142 mmol/L (ref 134–144)
eGFR: 89 mL/min/1.73 (ref >59)

## 2023-12-06 ENCOUNTER — Other Ambulatory Visit: Payer: Self-pay | Admitting: Gastroenterology

## 2023-12-06 DIAGNOSIS — R1310 Dysphagia, unspecified: Secondary | ICD-10-CM

## 2024-01-27 ENCOUNTER — Ambulatory Visit
Admission: RE | Admit: 2024-01-27 | Discharge: 2024-01-27 | Disposition: A | Discharge status: Home / Self Care | Source: Ambulatory Visit | Attending: Gastroenterology | Admitting: Gastroenterology

## 2024-01-27 DIAGNOSIS — R1310 Dysphagia, unspecified: Secondary | ICD-10-CM
